# Patient Record
Sex: Male | Born: 1980 | Race: White | Hispanic: No | Marital: Single | Smoking: Current every day smoker
Health system: Southern US, Community
[De-identification: ages and names within clinical notes are randomized; demographics above are authoritative.]

## PROBLEM LIST (undated history)

## (undated) DIAGNOSIS — I10 Essential (primary) hypertension: Secondary | ICD-10-CM

## (undated) HISTORY — PX: ANAL FISSURE REPAIR: SHX2312

---

## 2005-05-07 ENCOUNTER — Emergency Department: Payer: Self-pay | Admitting: Internal Medicine

## 2005-05-21 ENCOUNTER — Emergency Department: Payer: Self-pay | Admitting: Emergency Medicine

## 2005-07-26 ENCOUNTER — Emergency Department: Payer: Self-pay | Admitting: Emergency Medicine

## 2005-09-24 ENCOUNTER — Emergency Department: Payer: Self-pay | Admitting: Emergency Medicine

## 2006-09-10 ENCOUNTER — Emergency Department: Payer: Self-pay | Admitting: Emergency Medicine

## 2007-07-17 ENCOUNTER — Emergency Department: Payer: Self-pay | Admitting: Emergency Medicine

## 2008-04-27 ENCOUNTER — Ambulatory Visit: Payer: Self-pay | Admitting: Family Medicine

## 2008-05-28 ENCOUNTER — Emergency Department: Payer: Self-pay | Admitting: Emergency Medicine

## 2008-10-07 ENCOUNTER — Emergency Department: Payer: Self-pay | Admitting: Emergency Medicine

## 2008-12-15 ENCOUNTER — Emergency Department: Payer: Self-pay | Admitting: Unknown Physician Specialty

## 2009-05-06 ENCOUNTER — Emergency Department: Payer: Self-pay | Admitting: Emergency Medicine

## 2010-06-03 ENCOUNTER — Emergency Department: Payer: Self-pay | Admitting: Emergency Medicine

## 2010-10-25 ENCOUNTER — Emergency Department: Payer: Self-pay | Admitting: Emergency Medicine

## 2010-12-25 ENCOUNTER — Emergency Department: Payer: Self-pay | Admitting: Emergency Medicine

## 2011-07-21 ENCOUNTER — Emergency Department: Payer: Self-pay | Admitting: Unknown Physician Specialty

## 2011-10-21 ENCOUNTER — Observation Stay: Payer: Self-pay | Admitting: Surgery

## 2011-10-21 LAB — BASIC METABOLIC PANEL
Anion Gap: 8 (ref 7–16)
BUN: 10 mg/dL (ref 7–18)
Calcium, Total: 9.1 mg/dL (ref 8.5–10.1)
Creatinine: 0.76 mg/dL (ref 0.60–1.30)
EGFR (Non-African Amer.): >60
Glucose: 89 mg/dL (ref 65–99)
Osmolality: 276 (ref 275–301)
Sodium: 139 mmol/L (ref 136–145)

## 2011-10-21 LAB — CBC
HCT: 47.6 % (ref 40.0–52.0)
HGB: 16.6 g/dL (ref 13.0–18.0)
MCH: 32.1 pg (ref 26.0–34.0)
MCHC: 34.9 g/dL (ref 32.0–36.0)
Platelet: 287 10*3/uL (ref 150–440)
RBC: 5.17 10*6/uL (ref 4.40–5.90)

## 2012-12-09 ENCOUNTER — Emergency Department: Payer: Self-pay

## 2013-03-06 ENCOUNTER — Emergency Department: Payer: Self-pay | Admitting: Emergency Medicine

## 2013-11-26 ENCOUNTER — Emergency Department: Payer: Self-pay | Admitting: Emergency Medicine

## 2014-02-26 ENCOUNTER — Emergency Department: Payer: Self-pay | Admitting: Emergency Medicine

## 2014-07-26 ENCOUNTER — Emergency Department: Payer: Self-pay | Admitting: Emergency Medicine

## 2014-07-27 LAB — CBC WITH DIFFERENTIAL/PLATELET
BASOS PCT: 0.6 %
Basophil #: 0.1 10*3/uL (ref 0.0–0.1)
EOS ABS: 0.2 10*3/uL (ref 0.0–0.7)
EOS PCT: 1.1 %
HCT: 43.5 % (ref 40.0–52.0)
HGB: 15.1 g/dL (ref 13.0–18.0)
LYMPHS PCT: 16.2 %
Lymphocyte #: 2.5 10*3/uL (ref 1.0–3.6)
MCH: 32.1 pg (ref 26.0–34.0)
MCHC: 34.7 g/dL (ref 32.0–36.0)
MCV: 93 fL (ref 80–100)
Monocyte #: 1.6 x10 3/mm — ABNORMAL HIGH (ref 0.2–1.0)
Monocyte %: 10.4 %
NEUTROS ABS: 10.8 10*3/uL — AB (ref 1.4–6.5)
Neutrophil %: 71.7 %
Platelet: 277 10*3/uL (ref 150–440)
RBC: 4.69 10*6/uL (ref 4.40–5.90)
RDW: 13 % (ref 11.5–14.5)
WBC: 15.1 10*3/uL — AB (ref 3.8–10.6)

## 2014-07-27 LAB — COMPREHENSIVE METABOLIC PANEL
ALBUMIN: 3.4 g/dL (ref 3.4–5.0)
ANION GAP: 7 (ref 7–16)
Alkaline Phosphatase: 93 U/L
BUN: 11 mg/dL (ref 7–18)
Bilirubin,Total: 0.3 mg/dL (ref 0.2–1.0)
CO2: 27 mmol/L (ref 21–32)
Calcium, Total: 8.5 mg/dL (ref 8.5–10.1)
Chloride: 105 mmol/L (ref 98–107)
Creatinine: 0.74 mg/dL (ref 0.60–1.30)
EGFR (African American): >60
EGFR (Non-African Amer.): >60
GLUCOSE: 116 mg/dL — AB (ref 65–99)
OSMOLALITY: 278 (ref 275–301)
Potassium: 3.8 mmol/L (ref 3.5–5.1)
SGOT(AST): 22 U/L (ref 15–37)
SGPT (ALT): 23 U/L
Sodium: 139 mmol/L (ref 136–145)
Total Protein: 7.1 g/dL (ref 6.4–8.2)

## 2014-11-20 NOTE — Op Note (Signed)
PATIENT NAME:  Devin ScaleROACH, [place] M MR#:  [id number] DATE OF BIRTH:  March 14, 1981  DATE OF PROCEDURE:  10/21/2011  PREOPERATIVE DIAGNOSIS: Posterior perianal superficial abscess.   POSTOPERATIVE DIAGNOSIS: Chronic posterior anal fissure.   PROCEDURES PERFORMED:  Examination under anesthesia, unroofing of posterior anal fissure,  with drainage of superficial perianal abscess, open lateral internal sphincterotomy on the left side.   SURGEON: Raynald KempMark A Kathrine Rieves, M.D.   ANESTHESIA: General with LMA and 30 mL of 0.5% Marcaine with epinephrine.   INDICATIONS: This is a 34 year old white male who has been treated in the past for an anal fissure with medication.  This has gotten worse over the last several months. He presents to the Emergency Room today with exquisite pain and some question of infection. Limited examination in the Emergency Room demonstrated what appeared to be a perianal abscess. I discussed with him and his mother bringing him to the operating room for definitive diagnosis, drainage of the abscess, and any other further therapy based on the findings. They understand and wish to proceed.   FINDINGS: Chronic appearing large posterior anal fissure. Small superficial perianal posterior midline abscess. No fistula formation. 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   SPECIMENS: None.   ESTIMATED BLOOD LOSS: 25 mL.   DESCRIPTION OF PROCEDURE: With the patient in the supine position, general anesthesia with LMA was induced. He was then padded and positioned in dorsal lithotomy with candy-cane stirrups. The perineum was sterilely prepped and draped with Betadine solution. Time-out procedure was observed.   A digital examination demonstrated a very tight internal sphincter. In the posterior midline was a large, chronic-appearing anal fissure. It was tracking along a skin tag which appeared to be very irritated.  This skin tag was unroofed and the area was debrided with sharp technique with a scalpel.  Small amount of pus waqs drained.  Hemostasis was obtained with point cautery. A small incision was fashioned over the ano dentate line mucosa with cautery and the internal sphincter was identified, encircled with a hemostat clamp, and divided with electrocautery. The remaining small fibers more cephalad were divided with finger fracture technique. Hemostasis was obtained with direct pressure and the application of 30 mL of 0.5% Marcaine with epinephrine. The mucosal defect was reapproximated with a running locking 3-0 chromic suture.  With hemostasis being obtained on the operative field, the anal canal was packed with Gelfoam and Avitene. Pads were placed with mesh surgical underwear and the patient was then subsequently returned supine, extubated, and taken to the recovery room in stable and satisfactory condition by anesthesia.     ____________________________ Redge GainerMark A. Egbert GaribaldiBird, MD mab:bjt D: 10/21/2011 22:29:36 ET T: 10/22/2011 10:39:43 ET JOB#: [id number]  cc: Loraine LericheMark A. Egbert GaribaldiBird, MD, <Dictator> Raynald KempMARK A Conchita Truxillo MD ELECTRONICALLY SIGNED 10/22/2011 23:42

## 2014-11-20 NOTE — H&P (Signed)
    History of Present Illness Anal pain and drainage for months. Pain much worse in last few days. No fever. Been to [facility] twice and ttreated for genital herpes. Never seen a [facility]. Hurts the worst when he sits; not when he defecates. Last meal at 10AM today.    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[employer] Home  with his mother   Review of Systems:   Fever/Chills No    Cough No    Sputum No    Abdominal Pain No    Diarrhea No    Constipation No    Nausea/Vomiting No    SOB/DOE No    Chest Pain No    Dysuria No    Tolerating PT Yes    Tolerating Diet Yes   Physical Exam:   GEN WD, WN, obese    HEENT pink conjunctivae, PERRL, hearing intact to voice, moist oral mucosa, Oropharynx clear, good dentition    NECK supple  No masses    RESP normal resp effort  clear BS    CARD regular rate  no murmur  no Rub    ABD denies tenderness  no liver/spleen enlargement  soft  normal BS    LYMPH negative neck    SKIN skin turgor good    NEURO cranial nerves intact, follows commands, strength:, motor/sensory function intact    PSYCH alert, A+O to time, place, person, good insight    Additional Comments anal exam reveals erythema and edema in perianal skin, with purulent drainage from a superficial posterior midline perianal abscess. DRE not done.     Assessment/Admission Diagnosis Perirectal abscess    Plan EUA, Drainage of Perianal abscess   Electronic Signatures: Claude MangesMarterre, Elham Fini F (MD)  (Signed  25-Mar-13 18:48)  Authored: CHIEF COMPLAINT and HISTORY, PAST MEDICAL/SURGIAL HISTORY, ALLERGIES, HOME MEDICATIONS, FAMILY AND SOCIAL HISTORY, REVIEW OF SYSTEMS, PHYSICAL EXAM, ASSESSMENT AND PLAN   Last Updated: 25-Mar-13 18:48 by Claude MangesMarterre, Hue Frick F (MD)

## 2016-03-02 ENCOUNTER — Emergency Department
Admission: EM | Admit: 2016-03-02 | Discharge: 2016-03-02 | Disposition: A | Discharge status: Home / Self Care | Payer: Self-pay | Attending: Emergency Medicine | Admitting: Emergency Medicine

## 2016-03-02 ENCOUNTER — Encounter: Payer: Self-pay | Admitting: Emergency Medicine

## 2016-03-02 DIAGNOSIS — I10 Essential (primary) hypertension: Secondary | ICD-10-CM | POA: Insufficient documentation

## 2016-03-02 DIAGNOSIS — K0889 Other specified disorders of teeth and supporting structures: Secondary | ICD-10-CM | POA: Insufficient documentation

## 2016-03-02 DIAGNOSIS — F17210 Nicotine dependence, cigarettes, uncomplicated: Secondary | ICD-10-CM | POA: Insufficient documentation

## 2016-03-02 HISTORY — DX: Essential (primary) hypertension: I10

## 2016-03-02 MED ORDER — AMOXICILLIN 500 MG PO TABS: 500.0000 mg | ORAL_TABLET | Freq: Three times a day (TID) | ORAL | 0 refills | Status: DC

## 2016-03-02 MED ORDER — NAPROXEN 500 MG PO TABS: 500.0000 mg | ORAL_TABLET | Freq: Two times a day (BID) | ORAL | 0 refills | Status: AC

## 2016-03-02 NOTE — ED Triage Notes (Signed)
Bilateral upper dental pain x 2 days.

## 2016-03-02 NOTE — ED Provider Notes (Signed)
[facility] Emergency Department Provider Note ____________________________________________  Time seen: Approximately 3:16 PM  I have reviewed the triage vital signs and the nursing notes.   HISTORY  Chief Complaint Dental Pain   HPI Devin Barnes is a 35 y.o. male presents to the emergency department for evaluation of dental pain.Pain is been present for the past 2 days. He has taken ibuprofen without relief. He has been unable to schedule an appointment with his dentist.  Past Medical History:  Diagnosis Date  . Hypertension     There are no active problems to display for this patient.   Past Surgical History:  Procedure Laterality Date  . ANAL FISSURE REPAIR      Prior to Admission medications   Medication Sig Start Date End Date Taking? Authorizing Provider  amoxicillin (AMOXIL) 500 MG tablet Take 1 tablet (500 mg total) by mouth 3 (three) times daily. 03/02/16   Chinita Pester, FNP  naproxen (NAPROSYN) 500 MG tablet Take 1 tablet (500 mg total) by mouth 2 (two) times daily with a meal. 03/02/16 03/02/17  Chinita Pester, FNP    Allergies Review of patient's allergies indicates no known allergies.  No family history on file.  Social History Social History  Substance Use Topics  . Smoking status: Current Every Day Smoker    Packs/day: 1.00    Types: Cigarettes  . Smokeless tobacco: Not on file  . Alcohol use No    Review of Systems Constitutional: No recent illness. Well appearing. ENT: No cough or rhinorrhea Musculoskeletal: Negative for jaw pain/trismus. Skin: Negative for edema or erythema. ____________________________________________   PHYSICAL EXAM:  VITAL SIGNS: ED Triage Vitals  Enc Vitals Group     BP 03/02/16 1431 (!) 158/110     Pulse Rate 03/02/16 1431 88     Resp 03/02/16 1431 18     Temp 03/02/16 1431 98 F (36.7 C)     Temp Source 03/02/16 1431 Oral     SpO2 03/02/16 1431 98 %     Weight 03/02/16 1431 280 lb  (127 kg)     Height 03/02/16 1431  (1.778 m)     Head Circumference --      Peak Flow --      Pain Score 03/02/16 1433 8     Pain Loc --      Pain Edu? --      Excl. in GC? --     Constitutional: Alert and oriented. Well appearing and in no acute distress. Eyes: Conjunctivae are normal.EOMI. Mouth/Throat: Mucous membranes are moist. Oropharynx non-erythematous. Periodontal Exam    Hematological/Lymphatic/Immunilogical: No cervical lymphadenopathy. Respiratory: Normal respiratory effort.  Musculoskeletal: Full ROM x 4 extremities. Neurologic:  Normal speech and language. No gross focal neurologic deficits are appreciated. Speech is normal. No gait instability. Skin:  Atraumatic without edema or erythema of the fact. Psychiatric: Mood and affect are normal. Speech and behavior are normal.  ____________________________________________   LABS (all labs ordered are listed, but only abnormal results are displayed)  Labs Reviewed - No data to display ____________________________________________   RADIOLOGY   ____________________________________________   PROCEDURES  Procedure(s) performed: None  Critical Care performed: No  ____________________________________________   INITIAL IMPRESSION / ASSESSMENT AND PLAN / ED COURSE  Pertinent labs & imaging results that were available during my care of the patient were reviewed by me and considered in my medical decision making (see chart for details). Patient will be prescribed Amoxicillin and Naprosyn. Patient was  advised to see the dentist within 14 days. Also advised to take the antibiotic until finished. Instructed to return to the ER for symptoms that change or worsen if you are unable to schedule an appointment. ____________________________________________   FINAL CLINICAL IMPRESSION(S) / ED DIAGNOSES  Final diagnoses:  Pain, dental    Note:  This document was prepared using Dragon voice recognition software  and may include unintentional dictation errors.    Chinita Pester, FNP 03/02/16 1548    Jennye Moccasin, MD 03/02/16 [phone]

## 2017-04-17 ENCOUNTER — Emergency Department
Admission: EM | Admit: 2017-04-17 | Discharge: 2017-04-17 | Disposition: A | Discharge status: Home / Self Care | Payer: Self-pay | Attending: Emergency Medicine | Admitting: Emergency Medicine

## 2017-04-17 ENCOUNTER — Encounter: Payer: Self-pay | Admitting: Emergency Medicine

## 2017-04-17 DIAGNOSIS — F17210 Nicotine dependence, cigarettes, uncomplicated: Secondary | ICD-10-CM | POA: Insufficient documentation

## 2017-04-17 DIAGNOSIS — I10 Essential (primary) hypertension: Secondary | ICD-10-CM | POA: Insufficient documentation

## 2017-04-17 DIAGNOSIS — K029 Dental caries, unspecified: Secondary | ICD-10-CM | POA: Insufficient documentation

## 2017-04-17 MED ORDER — LIDOCAINE-EPINEPHRINE 2 %-1:100000 IJ SOLN
1.7000 mL | Freq: Once | INTRAMUSCULAR | Status: AC
  Administered 2017-04-17: 1.7 mL
  Filled 2017-04-17: qty 1.7

## 2017-04-17 MED ORDER — PENICILLIN V POTASSIUM 500 MG PO TABS
500.0000 mg | ORAL_TABLET | Freq: Once | ORAL | Status: AC
  Administered 2017-04-17: 500 mg via ORAL
  Filled 2017-04-17: qty 1

## 2017-04-17 MED ORDER — TRAMADOL HCL 50 MG PO TABS: 50.0000 mg | ORAL_TABLET | Freq: Two times a day (BID) | ORAL | 0 refills | Status: DC

## 2017-04-17 MED ORDER — PENICILLIN V POTASSIUM 500 MG PO TABS: 500.0000 mg | ORAL_TABLET | Freq: Four times a day (QID) | ORAL | 0 refills | Status: DC

## 2017-04-17 NOTE — ED Triage Notes (Signed)
Patient presents to the ED with dental pain to the right side of his mouth.  Patient is in no obvious distress at this time.

## 2017-04-17 NOTE — ED Notes (Addendum)
See triage note  States he thinks that he has 2 "bad"teeth on the right  Thinks he may have lost the fillings  States he took some ibu and some tramadol that he had left

## 2017-04-17 NOTE — ED Provider Notes (Signed)
[facility] Emergency Department Provider Note ____________________________________________  Time seen: 1551  I have reviewed the triage vital signs and the nursing notes.  HISTORY  Chief Complaint  Dental Pain  HPI Devin Barnes is a 36 y.o. male presents to the ED for evaluation of right upper jaw pain after drinking a soda. He has a history of poor dentition. He denies any fevers, chills, sweats, or nausea. He also denies any facial swelling, difficulty swallowing, or spontaneous drainage. He has taken ibuprofen, an old tramadol, and used topical EtOH with limited relief.   Past Medical History:  Diagnosis Date  . Hypertension    There are no active problems to display for this patient.  Past Surgical History:  Procedure Laterality Date  . ANAL FISSURE REPAIR      Prior to Admission medications   Medication Sig Start Date End Date Taking? Authorizing Provider  amoxicillin (AMOXIL) 500 MG tablet Take 1 tablet (500 mg total) by mouth 3 (three) times daily. 03/02/16   Triplett, Rulon Eisenmenger B, FNP  penicillin v potassium (VEETID) 500 MG tablet Take 1 tablet (500 mg total) by mouth 4 (four) times daily. 04/17/17   Evaan Tidwell, Charlesetta Ivory, PA-C  traMADol (ULTRAM) 50 MG tablet Take 1 tablet (50 mg total) by mouth 2 (two) times daily. 04/17/17   Renessa Wellnitz, Charlesetta Ivory, PA-C    Allergies Patient has no known allergies.  No family history on file.  Social History Social History  Substance Use Topics  . Smoking status: Current Every Day Smoker    Packs/day: 1.00    Types: Cigarettes  . Smokeless tobacco: Never Used  . Alcohol use No    Review of Systems  Constitutional: Negative for fever. Eyes: Negative for visual changes. ENT: Negative for sore throat. Right upper jaw pain as above.  Cardiovascular: Negative for chest pain. Respiratory: Negative for shortness of breath. Gastrointestinal: Negative for abdominal pain, vomiting and diarrhea. Neurological:  Negative for headaches, focal weakness or numbness. ____________________________________________  PHYSICAL EXAM:  VITAL SIGNS: ED Triage Vitals  Enc Vitals Group     BP 04/17/17 1524 (!) 167/103     Pulse Rate 04/17/17 1524 98     Resp 04/17/17 1524 18     Temp 04/17/17 1524 98.5 F (36.9 C)     Temp Source 04/17/17 1524 Oral     SpO2 04/17/17 1524 99 %     Weight 04/17/17 1524 280 lb (127 kg)     Height 04/17/17 1524  (1.778 m)     Head Circumference --      Peak Flow --      Pain Score 04/17/17 1523 8     Pain Loc --      Pain Edu? --      Excl. in GC? --     Constitutional: Alert and oriented. Well appearing and in no distress. Head: Normocephalic and atraumatic. Eyes: Conjunctivae are normal. Normal extraocular movements Ears: Canals clear. TMs intact bilaterally. Mouth/Throat: Generally poor dentition. Patient with chronically broken and decayed molars at the site of localized pain. Mucous membranes are moist. Uvula is midline and tonsils are flat. No focal swelling, edema, or fluctuance. No brawny sublingual swelling. No oral lesions.  Neck: Supple. No thyromegaly. Hematological/Lymphatic/Immunological: No cervical lymphadenopathy. Cardiovascular: Normal rate, regular rhythm. Normal distal pulses. Respiratory: Normal respiratory effort. No wheezes/rales/rhonchi. ____________________________________________  PROCEDURES  Pen VK 500 mg PO  DENTAL BLOCK  Authorized by: Lissa Hoard  Performed by:  Rose Clousing, PA-S Sherrie Sport) Consent: Verbal consent obtained. Required items: devices and special equipment available Time out: Immediately prior to procedure a "time out" was called to verify the correct patient, procedure, equipment, support staff and site/side marked as required.  Indication: pain Nerve block body site: right upper 2nd & 3rd molar  Preparation: Patient was prepped and draped in the usual sterile fashion. Needle gauge: 27 G Location  technique: anatomical landmarks  Local anesthetic: 2%-1:100000 lido w/ epi   Anesthetic total: 1.7 ml  Outcome: pain improved Patient tolerance: Patient tolerated the procedure well with no immediate complications. ____________________________________________  INITIAL IMPRESSION / ASSESSMENT AND PLAN / ED COURSE  Patient with ED evaluation of He is discharged with a prescription for penicillin  VK and Ultram (#10) to dose as directed. He will follow-up with the [facility] or a local dental provider for definitive treatment. Return precautions are provided.  ____________________________________________  FINAL CLINICAL IMPRESSION(S) / ED DIAGNOSES  Final diagnoses:  Pain due to dental caries      Lissa Hoard, PA-C 04/17/17 1701    Rockne Menghini, MD 04/17/17 2349

## 2017-04-17 NOTE — Discharge Instructions (Signed)
Take the antibiotic as directed until all pills are gone. Rinse with warm-salty water after every meal. Use a soft-bristled toothbrush twice daily. Follow-up with one of the local dental provider for routine dental care and extractions.   OPTIONS FOR DENTAL FOLLOW UP CARE  [place] [facility] - Local Safety Net Dental Clinics [email or web address]   [facility] [phone])  [facility] [phone])  [place] [phone] ext [phone])  [facility] [phone])  [facility] [phone]) This clinic caters to the indigent population and is on a lottery system. Location: [employer] [facility], [employer], [address] [place] Clinic Hours: Wednesdays from 6pm - 9pm, patients seen by a lottery system. For dates, call or go to [email or web address] Services: Cleanings, fillings and simple extractions. Payment Options: DENTAL WORK IS FREE OF CHARGE. Bring proof of income or support. Best way to get seen: Arrive at 5:15 pm - this is a lottery, NOT first come/first serve, so arriving earlier will not increase your chances of being seen.     [facility] [phone] Select option 1 for emergencies   Location: [facility], [place], [address], [place] Clinic Hours: No walk-ins accepted - call the day before to schedule an appointment. Check in times are 9:30 am and 1:30 pm. Services: Simple extractions, temporary fillings, pulpectomy/pulp debridement, uncomplicated abscess drainage. Payment Options: PAYMENT IS DUE AT THE TIME OF SERVICE.  Fee is usually $100-200, additional surgical procedures (e.g. abscess drainage) may be extra. Cash, checks, Visa/MasterCard accepted.  Can file Medicaid if patient is covered for dental - patient should call case worker to  check. No discount for [facility] patients. Best way to get seen: MUST call the day before and get onto the schedule. Can usually be seen the next 1-2 days. No walk-ins accepted.     [facility] [phone]   Location: [facility], [address], [place] Clinic Hours: M, W, Th, F 8am or 1:30pm, Tues 9a or 1:30 - first come/first served. Services: Simple extractions, temporary fillings, uncomplicated abscess drainage.  You do not need to be an [facility] resident. Payment Options: PAYMENT IS DUE AT THE TIME OF SERVICE. Dental insurance, otherwise sliding scale - bring proof of income or support. Depending on income and treatment needed, cost is usually $50-200. Best way to get seen: Arrive early as it is first come/first served.     [facility] [phone]   Location: [address] Clinic Hours: Mon-Thu 8a-5p Services: Most basic dental services including extractions and fillings. Payment Options: PAYMENT IS DUE AT THE TIME OF SERVICE. Sliding scale, up to 50% off - bring proof if income or support. Medicaid with dental option accepted. Best way to get seen: Call to schedule an appointment, can usually be seen within 2 weeks OR they will try to see walk-ins - show up at 8a or 2p (you may have to wait).     [facility] [phone] [place] RESIDENTS ONLY   Location: [facility], [address] [place], [place] [address] Clinic Hours: By appointment only. Monday - Thursday 8am-5pm, Friday 8am-12pm Services: Cleanings, fillings, extractions. Payment Options: PAYMENT IS DUE AT THE TIME OF SERVICE. Cash, Visa or MasterCard. Sliding scale - $30 minimum per service. Best way to get seen: Come in to office, complete packet and make an appointment - need proof  of income or support monies for each household member and proof of [facility]  residence. Usually takes about a month to get in.     [facility] [phone]   Location: [address] [facility] Clinic Hours: Walk-in Urgent Care Dental Services are offered Monday-Friday mornings only. The numbers of emergencies accepted daily is limited to the number of providers available. Maximum 15 - Mondays, Wednesdays & Thursdays Maximum 10 - Tuesdays & Fridays Services: You do not need to be a [facility] resident to be seen for a dental emergency. Emergencies are defined as pain, swelling, abnormal bleeding, or dental trauma. Walkins will receive x-rays if needed. NOTE: Dental cleaning is not an emergency. Payment Options: PAYMENT IS DUE AT THE TIME OF SERVICE. Minimum co-pay is $40.00 for uninsured patients. Minimum co-pay is $3.00 for Medicaid with dental coverage. Dental Insurance is accepted and must be presented at time of visit. Medicare does not cover dental. Forms of payment: Cash, credit card, checks. Best way to get seen: If not previously registered with the clinic, walk-in dental registration begins at 7:15 am and is on a first come/first serve basis. If previously registered with the clinic, call to make an appointment.     [facility] [phone] [place] RESIDENTS ONLY   Location: [address], [place], [place] Clinic Hours: Mon-Thu 10a-2p Services: Extractions only! Payment Options: FREE (donations accepted) - bring proof of income or support Best way to get seen: Call and schedule an appointment OR come at 8am on the 1st Monday of every month (except for holidays) when it is first come/first served.     [facility] [phone]   Location: [address] [place], [place] Clinic Hours: Friday mornings Services, Payment Options, Best way to get seen: Call for info

## 2017-10-21 ENCOUNTER — Emergency Department
Admission: EM | Admit: 2017-10-21 | Discharge: 2017-10-21 | Disposition: A | Discharge status: Home / Self Care | Payer: Self-pay | Attending: Emergency Medicine | Admitting: Emergency Medicine

## 2017-10-21 ENCOUNTER — Emergency Department: Payer: Self-pay

## 2017-10-21 ENCOUNTER — Encounter: Payer: Self-pay | Admitting: *Deleted

## 2017-10-21 DIAGNOSIS — Z23 Encounter for immunization: Secondary | ICD-10-CM | POA: Insufficient documentation

## 2017-10-21 DIAGNOSIS — W230XXA Caught, crushed, jammed, or pinched between moving objects, initial encounter: Secondary | ICD-10-CM | POA: Insufficient documentation

## 2017-10-21 DIAGNOSIS — Y9389 Activity, other specified: Secondary | ICD-10-CM | POA: Insufficient documentation

## 2017-10-21 DIAGNOSIS — I10 Essential (primary) hypertension: Secondary | ICD-10-CM | POA: Insufficient documentation

## 2017-10-21 DIAGNOSIS — Y999 Unspecified external cause status: Secondary | ICD-10-CM | POA: Insufficient documentation

## 2017-10-21 DIAGNOSIS — F17210 Nicotine dependence, cigarettes, uncomplicated: Secondary | ICD-10-CM | POA: Insufficient documentation

## 2017-10-21 DIAGNOSIS — Y929 Unspecified place or not applicable: Secondary | ICD-10-CM | POA: Insufficient documentation

## 2017-10-21 DIAGNOSIS — S61210A Laceration without foreign body of right index finger without damage to nail, initial encounter: Secondary | ICD-10-CM | POA: Insufficient documentation

## 2017-10-21 MED ORDER — BUPIVACAINE HCL 0.25 % IJ SOLN: 30.0000 mL | Freq: Once | INTRAMUSCULAR | Status: DC

## 2017-10-21 MED ORDER — BUPIVACAINE HCL (PF) 0.25 % IJ SOLN
INTRAMUSCULAR | Status: AC
  Filled 2017-10-21: qty 30

## 2017-10-21 MED ORDER — HYDROCODONE-ACETAMINOPHEN 5-325 MG PO TABS: 1.0000 | ORAL_TABLET | Freq: Four times a day (QID) | ORAL | 0 refills | Status: DC | PRN

## 2017-10-21 MED ORDER — TETANUS-DIPHTH-ACELL PERTUSSIS 5-2.5-18.5 LF-MCG/0.5 IM SUSP
0.5000 mL | Freq: Once | INTRAMUSCULAR | Status: AC
  Administered 2017-10-21: 0.5 mL via INTRAMUSCULAR
  Filled 2017-10-21: qty 0.5

## 2017-10-21 NOTE — Discharge Instructions (Signed)
Today I put in a total of 5 6-0 nylon sutures.  Please keep your hand clean and dry for the next 24 hours and thereafter use warm soapy water to help keep it clean.  Follow-up with the hand specialist in 2 days for reevaluation return to the emergency department sooner for any concerns whatsoever.  It was a pleasure to take care of you today, and thank you for coming to our emergency department.  If you have any questions or concerns before leaving please ask the nurse to grab me and I'm more than happy to go through your aftercare instructions again.  If you were prescribed any opioid pain medication today such as Norco, Vicodin, Percocet, morphine, hydrocodone, or oxycodone please make sure you do not drive when you are taking this medication as it can alter your ability to drive safely.  If you have any concerns once you are home that you are not improving or are in fact getting worse before you can make it to your follow-up appointment, please do not hesitate to call [phone] and come back for further evaluation.  Merrily BrittleNeil Eloisa Chokshi, MD  No results found for this or any previous visit. Dg Finger Index Right  Result Date: 10/21/2017 CLINICAL DATA:  Laceration and crush injury EXAM: RIGHT INDEX FINGER 2+V COMPARISON:  None. FINDINGS: There is no evidence of fracture or dislocation. There is no evidence of arthropathy or other focal bone abnormality. Soft tissue laceration medial to the proximal interphalangeal joint without radiopaque foreign body. IMPRESSION: No fracture or dislocation of the right index finger. Electronically Signed   By: Deatra RobinsonKevin  Herman M.D.   On: 10/21/2017 02:07

## 2017-10-21 NOTE — ED Provider Notes (Signed)
[facility] Emergency Department Provider Note  ____________________________________________   First MD Initiated Contact with Patient 10/21/17 [id number]     (approximate)  I have reviewed the triage vital signs and the nursing notes.   HISTORY  Chief Complaint Hand Pain    HPI Devin Barnes is a 37 y.o. male who self presents to the emergency department with an injury to his right index finger.  He is a Mudloggerright-hand-dominant machinist.  This evening he was helping a friend lift a washing machine up the stairs when he slipped and fell down crushing his right index finger.  His tetanus is unknown.  He suffered immediate severe pain in his right index finger.  Pain is nonradiating.  It is sharp.  It is worse with movement improved with rest.  He did not washout the wound.  Past Medical History:  Diagnosis Date  . Hypertension     There are no active problems to display for this patient.   Past Surgical History:  Procedure Laterality Date  . ANAL FISSURE REPAIR      Prior to Admission medications   Medication Sig Start Date End Date Taking? Authorizing Provider  amoxicillin (AMOXIL) 500 MG tablet Take 1 tablet (500 mg total) by mouth 3 (three) times daily. 03/02/16   Triplett, Rulon Eisenmengerari B, FNP  HYDROcodone-acetaminophen (NORCO) 5-325 MG tablet Take 1 tablet by mouth every 6 (six) hours as needed for up to 7 doses for severe pain. 10/21/17   Merrily Brittleifenbark, Jem Castro, MD  penicillin v potassium (VEETID) 500 MG tablet Take 1 tablet (500 mg total) by mouth 4 (four) times daily. 04/17/17   Menshew, Charlesetta IvoryJenise V Bacon, PA-C  traMADol (ULTRAM) 50 MG tablet Take 1 tablet (50 mg total) by mouth 2 (two) times daily. 04/17/17   Menshew, Charlesetta IvoryJenise V Bacon, PA-C    Allergies Patient has no known allergies.  No family history on file.  Social History Social History   Tobacco Use  . Smoking status: Current Every Day Smoker    Packs/day: 1.00    Types: Cigarettes  . Smokeless tobacco:  Never Used  Substance Use Topics  . Alcohol use: No  . Drug use: Never    Review of Systems Constitutional: No fever/chills ENT: No sore throat. Cardiovascular: Denies chest pain. Respiratory: Denies shortness of breath. Gastrointestinal: No abdominal pain.  No nausea, no vomiting.  No diarrhea.  No constipation. Musculoskeletal: Negative for back pain. Neurological: Negative for headaches   ____________________________________________   PHYSICAL EXAM:  VITAL SIGNS: ED Triage Vitals  Enc Vitals Group     BP 10/21/17 0124 (!) 153/108     Pulse Rate 10/21/17 0124 (!) 107     Resp 10/21/17 0124 16     Temp 10/21/17 0124 99.1 F (37.3 C)     Temp src --      SpO2 10/21/17 0124 99 %     Weight 10/21/17 0124 300 lb (136.1 kg)     Height 10/21/17 0124 5\' 10"  (1.778 m)     Head Circumference --      Peak Flow --      Pain Score 10/21/17 0123 0     Pain Loc --      Pain Edu? --      Excl. in GC? --     Constitutional: Alert and oriented x4 joking laughing well-appearing nontoxic no diaphoresis speaks full clear sentences Head: Atraumatic. Nose: No congestion/rhinnorhea. Mouth/Throat: No trismus Neck: No stridor.   Cardiovascular: Regular rate  and rhythm Respiratory: Normal respiratory effort.  No retractions Neurologic:  Normal speech and language. No gross focal neurologic deficits are appreciated.  Skin: 3 cm laceration to the volar surface of right index finger roughly over the PIP.  He can fully flex at MCP PIP and DIP.  Wound explored in a bloodless field through full range of motion and no tendon involvement identified.    ____________________________________________  LABS (all labs ordered are listed, but only abnormal results are displayed)  Labs Reviewed - No data to display   __________________________________________  EKG   ____________________________________________  RADIOLOGY  X-ray of the finger reviewed by me with no evidence of  fracture ____________________________________________   DIFFERENTIAL includes but not limited to  Laceration, tendon injury, fracture   PROCEDURES  Procedure(s) performed: Yes  .Marland KitchenLaceration Repair Date/Time: 10/21/2017 4:45 AM Performed by: Merrily Brittle, MD Authorized by: Merrily Brittle, MD   Consent:    Consent obtained:  Verbal   Consent given by:  Patient   Risks discussed:  Infection, pain, retained foreign body, poor cosmetic result and poor wound healing Anesthesia (see MAR for exact dosages):    Anesthesia method:  Local infiltration   Local anesthetic:  Lidocaine 1% WITH epi Laceration details:    Location:  Finger   Finger location:  R index finger   Length (cm):  3 Repair type:    Repair type:  Simple Pre-procedure details:    Preparation:  Patient was prepped and draped in usual sterile fashion and imaging obtained to evaluate for foreign bodies Exploration:    Hemostasis achieved with:  Direct pressure   Wound exploration: entire depth of wound probed and visualized     Contaminated: no   Treatment:    Area cleansed with:  Soap and water   Amount of cleaning:  Extensive   Irrigation solution:  Tap water   Irrigation method:  Tap   Visualized foreign bodies/material removed: no   Skin repair:    Repair method:  Sutures   Suture size:  6-0   Suture material:  Nylon   Number of sutures:  5 Approximation:    Approximation:  Close Post-procedure details:    Dressing:  Sterile dressing   Patient tolerance of procedure:  Tolerated well, no immediate complications    Critical Care performed: no  Observation: no ____________________________________________   INITIAL IMPRESSION / ASSESSMENT AND PLAN / ED COURSE  Pertinent labs & imaging results that were available during my care of the patient were reviewed by me and considered in my medical decision making (see chart for details).  Patient arrives with a crush injury sustaining a laceration to  the volar surface of his dominant index finger.  X-ray with no foreign bodies and no evidence of fracture.  Digital block performed and the wound was then washed out with copious amounts of soap and water.  I then explored the wound in a bloodless field with good overhead lighting through full range of motion and identified no tendon injuries.  Wound was then closed with a total of 5 6-0 nylon sutures with good cosmesis.  Tetanus updated.  We will have the patient follow-up with Orth O hand in 2 days for recheck.  Strict return precautions have been given and the patient verbalizes understanding and agreement the plan.      ____________________________________________   FINAL CLINICAL IMPRESSION(S) / ED DIAGNOSES  Final diagnoses:  Laceration of right index finger without foreign body without damage to nail, initial encounter  NEW MEDICATIONS STARTED DURING THIS VISIT:  Discharge Medication List as of 10/21/2017  4:42 AM    START taking these medications   Details  HYDROcodone-acetaminophen (NORCO) 5-325 MG tablet Take 1 tablet by mouth every 6 (six) hours as needed for up to 7 doses for severe pain., Starting Tue 10/21/2017, Print         Note:  This document was prepared using Dragon voice recognition software and may include unintentional dictation errors.      Merrily Brittle, MD 10/23/17 2214

## 2017-10-21 NOTE — ED Triage Notes (Signed)
Just 30 minutes PTA, was helping to carry a washer up the steps and it dropped, catching his finger in between the washer and metal steps. Right hand second finger effected, avulsion to the posterior finger, lac to the anterior finger, bleeding controlled. C/o numbness and swelling.

## 2017-10-28 ENCOUNTER — Emergency Department
Admission: EM | Admit: 2017-10-28 | Discharge: 2017-10-28 | Disposition: A | Discharge status: Home / Self Care | Payer: Self-pay | Attending: Emergency Medicine | Admitting: Emergency Medicine

## 2017-10-28 ENCOUNTER — Encounter: Payer: Self-pay | Admitting: Emergency Medicine

## 2017-10-28 ENCOUNTER — Other Ambulatory Visit: Payer: Self-pay

## 2017-10-28 DIAGNOSIS — K0889 Other specified disorders of teeth and supporting structures: Secondary | ICD-10-CM | POA: Insufficient documentation

## 2017-10-28 DIAGNOSIS — I10 Essential (primary) hypertension: Secondary | ICD-10-CM | POA: Insufficient documentation

## 2017-10-28 DIAGNOSIS — G8929 Other chronic pain: Secondary | ICD-10-CM | POA: Insufficient documentation

## 2017-10-28 DIAGNOSIS — F17210 Nicotine dependence, cigarettes, uncomplicated: Secondary | ICD-10-CM | POA: Insufficient documentation

## 2017-10-28 DIAGNOSIS — K089 Disorder of teeth and supporting structures, unspecified: Secondary | ICD-10-CM

## 2017-10-28 MED ORDER — LIDOCAINE-EPINEPHRINE 2 %-1:100000 IJ SOLN
INTRAMUSCULAR | Status: AC
  Administered 2017-10-28: 1.7 mL
  Filled 2017-10-28: qty 1.7

## 2017-10-28 MED ORDER — AMOXICILLIN 500 MG PO TABS: 500.0000 mg | ORAL_TABLET | Freq: Three times a day (TID) | ORAL | 0 refills | Status: DC

## 2017-10-28 MED ORDER — TRAMADOL HCL 50 MG PO TABS: 50.0000 mg | ORAL_TABLET | Freq: Four times a day (QID) | ORAL | 0 refills | Status: DC | PRN

## 2017-10-28 MED ORDER — LIDOCAINE-EPINEPHRINE 2 %-1:100000 IJ SOLN
1.7000 mL | Freq: Once | INTRAMUSCULAR | Status: AC
Start: 2017-10-28 — End: 2017-10-28
  Administered 2017-10-28: 1.7 mL
  Filled 2017-10-28: qty 1.7

## 2017-10-28 NOTE — ED Triage Notes (Signed)
Pt to ED c/o right upper dental pain x2-3 days, denies fevers, painful to chew on right side.

## 2017-10-28 NOTE — Discharge Instructions (Signed)
Please call and schedule a dental appointment as soon as possible. You will need to be seen within the next 14 days. Return to the emergency department for symptoms that change or worsen if you're unable to schedule an appointment.  OPTIONS FOR DENTAL FOLLOW UP CARE  [place] Department of Health and Human Services - Local Safety Net Dental Clinics http://www.ncdhhs.gov/dph/oralhealth/services/safetynetclinics.htm   [facility] ([phone])  [facility] ([phone])  [facility] ([phone])  [facility] ([phone])  [facility] ([phone]) This clinic caters to the indigent population and is on a lottery system. Location: [facility], [address], [place] Clinic Hours: Wednesdays from 6pm - 9pm, patients seen by a lottery system. For dates, call or go to [email or web address] Services: Cleanings, fillings and simple extractions. Payment Options: DENTAL WORK IS FREE OF CHARGE. Bring proof of income or support. Best way to get seen: Arrive at 5:15 pm - this is a lottery, NOT first come/first serve, so arriving earlier will not increase your chances of being seen.     [facility] [phone] Select option 1 for emergencies   Location: [facility], [address], [place] Clinic Hours: No walk-ins accepted - call the day before to schedule an appointment. Check in times are 9:30 am and 1:30 pm. Services: Simple extractions, temporary fillings, pulpectomy/pulp debridement, uncomplicated abscess drainage. Payment Options: PAYMENT IS DUE AT THE TIME OF SERVICE.  Fee is usually $100-200, additional surgical procedures (e.g. abscess drainage) may be extra. Cash, checks, Visa/MasterCard accepted.  Can file Medicaid if patient is covered for dental - patient should call case worker to check. No  discount for [facility] patients. Best way to get seen: MUST call the day before and get onto the schedule. Can usually be seen the next 1-2 days. No walk-ins accepted.     [facility] [phone]   Location: [facility], [address], [place] Clinic Hours: M, W, Th, F 8am or 1:30pm, Tues 9a or 1:30 - first come/first served. Services: Simple extractions, temporary fillings, uncomplicated abscess drainage.  You do not need to be an [place] resident. Payment Options: PAYMENT IS DUE AT THE TIME OF SERVICE. Dental insurance, otherwise sliding scale - bring proof of income or support. Depending on income and treatment needed, cost is usually $50-200. Best way to get seen: Arrive early as it is first come/first served.     [facility] [phone]   Location: [address] Clinic Hours: Mon-Thu 8a-5p Services: Most basic dental services including extractions and fillings. Payment Options: PAYMENT IS DUE AT THE TIME OF SERVICE. Sliding scale, up to 50% off - bring proof if income or support. Medicaid with dental option accepted. Best way to get seen: Call to schedule an appointment, can usually be seen within 2 weeks OR they will try to see walk-ins - show up at 8a or 2p (you may have to wait).     [facility] [phone] [place] RESIDENTS ONLY   Location: [facility], [address], [place], [place] [address] Clinic Hours: By appointment only. Monday - Thursday 8am-5pm, Friday 8am-12pm Services: Cleanings, fillings, extractions. Payment Options: PAYMENT IS DUE AT THE TIME OF SERVICE. Cash, Visa or MasterCard. Sliding scale - $30 minimum per service. Best way to get seen: Come in to office, complete packet and make an appointment -   need proof of income or support monies for each household member and proof of [place]  residence. Usually takes about a month to get in.     [facility] [phone]   Location: [address] [place] Clinic Hours: Walk-in Urgent Care Dental Services are offered Monday-Friday mornings only. The numbers of emergencies accepted daily is limited to the number of providers available. Maximum 15 - Mondays, Wednesdays & Thursdays Maximum 10 - Tuesdays & Fridays Services: You do not need to be a [place] resident to be seen for a dental emergency. Emergencies are defined as pain, swelling, abnormal bleeding, or dental trauma. Walkins will receive x-rays if needed. NOTE: Dental cleaning is not an emergency. Payment Options: PAYMENT IS DUE AT THE TIME OF SERVICE. Minimum co-pay is $40.00 for uninsured patients. Minimum co-pay is $3.00 for Medicaid with dental coverage. Dental Insurance is accepted and must be presented at time of visit. Medicare does not cover dental. Forms of payment: Cash, credit card, checks. Best way to get seen: If not previously registered with the clinic, walk-in dental registration begins at 7:15 am and is on a first come/first serve basis. If previously registered with the clinic, call to make an appointment.     [facility] [phone] [place] RESIDENTS ONLY   Location: [address], [place], [place] Clinic Hours: Mon-Thu 10a-2p Services: Extractions only! Payment Options: FREE (donations accepted) - bring proof of income or support Best way to get seen: Call and schedule an appointment OR come at 8am on the 1st Monday of every month (except for holidays) when it is first come/first served.     [facility] [phone]   Location: [address], [place] Clinic Hours: Friday mornings Services, Payment Options, Best way to get seen: Call for info  

## 2017-10-28 NOTE — ED Notes (Signed)
Has tooth abscess for few days, but it has been hurting on and off for about 3 months.  Also had stitches in right index finger about a week ago.  He took them out himself.  Says he does have some numbness feeling distally.  The wound is well healed.

## 2017-10-28 NOTE — ED Provider Notes (Signed)
[facility] Emergency Department Provider Note ____________________________________________  Time seen: Approximately 5:54 PM  I have reviewed the triage vital signs and the nursing notes.   HISTORY  Chief Complaint Dental Pain   HPI Devin Barnes is a 37 y.o. male who presents to the emergency department for treatment and evaluation of dental pain. Pain is intermittent and has had multiple rounds of antibiotics for the same, but has not had any dental work. No relief with ibuprofen.   Past Medical History:  Diagnosis Date  . Hypertension     There are no active problems to display for this patient.   Past Surgical History:  Procedure Laterality Date  . ANAL FISSURE REPAIR      Prior to Admission medications   Medication Sig Start Date End Date Taking? Authorizing Provider  amoxicillin (AMOXIL) 500 MG tablet Take 1 tablet (500 mg total) by mouth 3 (three) times daily. 10/28/17   Saahas Hidrogo, Rulon Eisenmengerari B, FNP  HYDROcodone-acetaminophen (NORCO) 5-325 MG tablet Take 1 tablet by mouth every 6 (six) hours as needed for up to 7 doses for severe pain. 10/21/17   Merrily Brittleifenbark, Neil, MD  penicillin v potassium (VEETID) 500 MG tablet Take 1 tablet (500 mg total) by mouth 4 (four) times daily. 04/17/17   Menshew, Charlesetta IvoryJenise V Bacon, PA-C  traMADol (ULTRAM) 50 MG tablet Take 1 tablet (50 mg total) by mouth every 6 (six) hours as needed. 10/28/17   Chinita Pesterriplett, Romeka Scifres B, FNP    Allergies Patient has no known allergies.  History reviewed. No pertinent family history.  Social History Social History   Tobacco Use  . Smoking status: Current Every Day Smoker    Packs/day: 1.00    Types: Cigarettes  . Smokeless tobacco: Never Used  Substance Use Topics  . Alcohol use: No  . Drug use: Never    Review of Systems Constitutional: Negative for fever ENT: Positive for dental pain.  Musculoskeletal: Negative for trismus  Skin: Negative for erythema or facial  edema. ____________________________________________   PHYSICAL EXAM:  VITAL SIGNS: ED Triage Vitals [10/28/17 1540]  Enc Vitals Group     BP 114/79     Pulse Rate 87     Resp 16     Temp (!) 97.5 F (36.4 C)     Temp Source Oral     SpO2 99 %     Weight 300 lb (136.1 kg)     Height 5\' 11"  (1.803 m)     Head Circumference      Peak Flow      Pain Score 6     Pain Loc      Pain Edu?      Excl. in GC?     Constitutional: Alert and oriented. Well appearing and in no acute distress. Eyes: Conjunctivae are clear without discharge or drainage. Mouth/Throat: Airway is patent Periodontal Exam    Hematological/Lymphatic/Immunilogical: No lymphadenopathy Respiratory: Breath sounds clear to auscultation. Musculoskeletal: No trismus of the jaw. Neurologic: Awake, alert, and oriented x 4  Skin:  No erythema or facial edema Psychiatric: Affect and behavior is appropriate  ____________________________________________   LABS (all labs ordered are listed, but only abnormal results are displayed)  Labs Reviewed - No data to display ____________________________________________   RADIOLOGY  Not indicated ____________________________________________   PROCEDURES  Procedure(s) performed: Dental block performed with Lidocaine 2% with epi. Patient tolerated the procedure well. Good anesthesia and pain relief.  Critical Care performed: No ____________________________________________   INITIAL IMPRESSION /  ASSESSMENT AND PLAN / ED COURSE  Devin Barnes is a 37 y.o. male who presents to the emergency department for treatment of evaluation of dental pain. Dental block performed with good results and relief of pain. He will be given prescriptions for amoxicillin, 3 doses of norco, and naprosyn. He was advised to see the dentist as soon as possible.  Pertinent labs & imaging results that were available during my care of the patient were reviewed by me and considered in my medical  decision making (see chart for details).  ____________________________________________   FINAL CLINICAL IMPRESSION(S) / ED DIAGNOSES  Final diagnoses:  Chronic dental pain    Discharge Medication List as of 10/28/2017  6:14 PM      If controlled substance prescribed during this visit, 12 month history viewed on the [facility] prior to issuing an initial prescription for Schedule II or III opiod.  Note:  This document was prepared using Dragon voice recognition software and may include unintentional dictation errors.     Chinita Pester, FNP 10/28/17 Berna Spare    Minna Antis, MD 10/28/17 [phone]

## 2018-02-18 ENCOUNTER — Encounter: Payer: Self-pay | Admitting: Emergency Medicine

## 2018-02-18 ENCOUNTER — Other Ambulatory Visit: Payer: Self-pay

## 2018-02-18 ENCOUNTER — Emergency Department
Admission: EM | Admit: 2018-02-18 | Discharge: 2018-02-18 | Disposition: A | Discharge status: Home / Self Care | Payer: Self-pay | Attending: Emergency Medicine | Admitting: Emergency Medicine

## 2018-02-18 DIAGNOSIS — F17210 Nicotine dependence, cigarettes, uncomplicated: Secondary | ICD-10-CM | POA: Insufficient documentation

## 2018-02-18 DIAGNOSIS — K029 Dental caries, unspecified: Secondary | ICD-10-CM | POA: Insufficient documentation

## 2018-02-18 DIAGNOSIS — I10 Essential (primary) hypertension: Secondary | ICD-10-CM | POA: Insufficient documentation

## 2018-02-18 MED ORDER — AMOXICILLIN 500 MG PO CAPS: 500.0000 mg | ORAL_CAPSULE | Freq: Three times a day (TID) | ORAL | 0 refills | Status: DC

## 2018-02-18 NOTE — ED Triage Notes (Signed)
Pt to ED from home c/o right lower dental pain x2-3 days.

## 2018-02-18 NOTE — Discharge Instructions (Signed)
Take the antibiotics as directed. Follow-up with one of the local community clinics for definitive dental care.   OPTIONS FOR DENTAL FOLLOW UP CARE  [place] Department of Health and Human Services - Local Safety Net Dental Clinics [email or web address]   [facility] [phone])  Sharl Ma [phone])  [place] [phone] ext [phone])  [facility] [phone])  [facility] [phone]) This clinic caters to the indigent population and is on a lottery system. Location: [employer] [facility], [employer], [address] [place] Clinic Hours: Wednesdays from 6pm - 9pm, patients seen by a lottery system. For dates, call or go to [email or web address] Services: Cleanings, fillings and simple extractions. Payment Options: DENTAL WORK IS FREE OF CHARGE. Bring proof of income or support. Best way to get seen: Arrive at 5:15 pm - this is a lottery, NOT first come/first serve, so arriving earlier will not increase your chances of being seen.     [facility] [phone] Select option 1 for emergencies   Location: [facility], [place], [address] [place] Clinic Hours: No walk-ins accepted - call the day before to schedule an appointment. Check in times are 9:30 am and 1:30 pm. Services: Simple extractions, temporary fillings, pulpectomy/pulp debridement, uncomplicated abscess drainage. Payment Options: PAYMENT IS DUE AT THE TIME OF SERVICE.  Fee is usually $100-200, additional surgical procedures (e.g. abscess drainage) may be extra. Cash, checks, Visa/MasterCard accepted.  Can file Medicaid if patient is covered for dental - patient should call case worker to check. No discount for [facility] patients. Best way to get seen: MUST call the day before and get onto the schedule. Can  usually be seen the next 1-2 days. No walk-ins accepted.     [facility] [phone]   Location: [facility], [address] [place] Clinic Hours: M, W, Th, F 8am or 1:30pm, Tues 9a or 1:30 - first come/first served. Services: Simple extractions, temporary fillings, uncomplicated abscess drainage.  You do not need to be an [facility] resident. Payment Options: PAYMENT IS DUE AT THE TIME OF SERVICE. Dental insurance, otherwise sliding scale - bring proof of income or support. Depending on income and treatment needed, cost is usually $50-200. Best way to get seen: Arrive early as it is first come/first served.     [facility] [phone]   Location: [address] Clinic Hours: Mon-Thu 8a-5p Services: Most basic dental services including extractions and fillings. Payment Options: PAYMENT IS DUE AT THE TIME OF SERVICE. Sliding scale, up to 50% off - bring proof if income or support. Medicaid with dental option accepted. Best way to get seen: Call to schedule an appointment, can usually be seen within 2 weeks OR they will try to see walk-ins - show up at 8a or 2p (you may have to wait).     [facility] [phone] [place] RESIDENTS ONLY   Location: [facility], [address] [place], [place] [address] Clinic Hours: By appointment only. Monday - Thursday 8am-5pm, Friday 8am-12pm Services: Cleanings, fillings, extractions. Payment Options: PAYMENT IS DUE AT THE TIME OF SERVICE. Cash, Visa or MasterCard. Sliding scale - $30 minimum per service. Best way to get seen: Come in to office, complete packet and make an appointment - need proof of income or support monies for each household member and proof of [facility] residence. Usually takes about a month  to get in.     [facility] [phone]   Location: [address] [facility] Hours: Walk-in Urgent Care Dental Services are offered Monday-Friday mornings only. The numbers of emergencies accepted daily is limited to the number of providers available. Maximum 15 - Mondays, Wednesdays & Thursdays Maximum 10 - Tuesdays & Fridays Services: You do not need to be a [facility] resident to be seen for a dental emergency. Emergencies are defined as pain, swelling, abnormal bleeding, or dental trauma. Walkins will receive x-rays if needed. NOTE: Dental cleaning is not an emergency. Payment Options: PAYMENT IS DUE AT THE TIME OF SERVICE. Minimum co-pay is $40.00 for uninsured patients. Minimum co-pay is $3.00 for Medicaid with dental coverage. Dental Insurance is accepted and must be presented at time of visit. Medicare does not cover dental. Forms of payment: Cash, credit card, checks. Best way to get seen: If not previously registered with the clinic, walk-in dental registration begins at 7:15 am and is on a first come/first serve basis. If previously registered with the clinic, call to make an appointment.     [facility] [phone] [place] RESIDENTS ONLY   Location: [address], [place], [place] Clinic Hours: Mon-Thu 10a-2p Services: Extractions only! Payment Options: FREE (donations accepted) - bring proof of income or support Best way to get seen: Call and schedule an appointment OR come at 8am on the 1st Monday of every month (except for holidays) when it is first come/first served.     [facility] [phone]   Location: [address], [place] Clinic Hours: Friday mornings Services, Payment Options, Best way to get seen: Call for info

## 2018-02-18 NOTE — ED Provider Notes (Signed)
[facility] Emergency Department Provider Note ____________________________________________  Time seen: 1450  I have reviewed the triage vital signs and the nursing notes.  HISTORY History and exam per S. Defillipo, PA-S Sherrie Sport)  Chief Complaint  Dental Pain  HPI Devin Barnes is a 37 y.o. male presents to the ED for evaluation of dental pain and chronic dental caries.  Patient is been seen in the ED on previous occasions for similar complaints.  He reports pain over the last 2 to 3 days.  Denies any fevers, chills, sweats, chest pain, or shortness of breath.  The patient denies any difficulty swallowing, breathing, controlling oral secretions.  He denies any facial or gum swelling.  Patient denies any interim evaluation by dental provider.  Past Medical History:  Diagnosis Date  . Hypertension     There are no active problems to display for this patient.   Past Surgical History:  Procedure Laterality Date  . ANAL FISSURE REPAIR      Prior to Admission medications   Medication Sig Start Date End Date Taking? Authorizing Provider  amoxicillin (AMOXIL) 500 MG capsule Take 1 capsule (500 mg total) by mouth 3 (three) times daily. 02/18/18   Jim Philemon, Charlesetta Ivory, PA-C    Allergies Patient has no known allergies.  History reviewed. No pertinent family history.  Social History Social History   Tobacco Use  . Smoking status: Current Every Day Smoker    Packs/day: 1.00    Types: Cigarettes  . Smokeless tobacco: Never Used  Substance Use Topics  . Alcohol use: No  . Drug use: Never    Review of Systems  Constitutional: Negative for fever. Eyes: Negative for visual changes. ENT: Negative for sore throat.  Dental pain as above. Cardiovascular: Negative for chest pain. Respiratory: Negative for shortness of breath. Gastrointestinal: Negative for abdominal pain, vomiting and diarrhea. Musculoskeletal: Negative for back pain. Skin: Negative for  rash. Neurological: Negative for headaches, focal weakness or numbness. ____________________________________________  PHYSICAL EXAM:  VITAL SIGNS: ED Triage Vitals [02/18/18 1405]  Enc Vitals Group     BP (!) 168/131     Pulse Rate 80     Resp 16     Temp 97.7 F (36.5 C)     Temp Source Oral     SpO2 99 %     Weight 300 lb (136.1 kg)     Height 5\' 11"  (1.803 m)     Head Circumference      Peak Flow      Pain Score 10     Pain Loc      Pain Edu?      Excl. in GC?     Constitutional: Alert and oriented. Well appearing and in no distress. Head: Normocephalic and atraumatic. Eyes: Conjunctivae are normal. PERRL. Normal extraocular movements Ears: Canals clear. TMs intact bilaterally. Nose: No congestion/rhinorrhea/epistaxis. Mouth/Throat: Mucous membranes are moist. Neck: Supple. No thyromegaly. Hematological/Lymphatic/Immunological: No cervical lymphadenopathy. Cardiovascular: Normal rate, regular rhythm. Normal distal pulses. Respiratory: Normal respiratory effort. No wheezes/rales/rhonchi. Musculoskeletal: Nontender with normal range of motion in all extremities.  Neurologic:  Normal gait without ataxia. Normal speech and language. No gross focal neurologic deficits are appreciated. Skin:  Skin is warm, dry and intact. No rash noted. Psychiatric: Mood and affect are normal. Patient exhibits appropriate insight and judgment. ____________________________________________  INITIAL IMPRESSION / ASSESSMENT AND PLAN / ED COURSE  Patient with ED evaluation of chronic dental caries and acute dental pain.  Patient was treated empirically  for dental infection.  He is given a list of dental providers for which he should contact definitive treatment.  Patient was also advised that he should attempt to make contact with a dental provider with one the local clinics who are also able to provide antibiotic coverage as necessary.  Patient is discharged with a prescription for amoxicillin.   He should return to the ED as needed. ____________________________________________  FINAL CLINICAL IMPRESSION(S) / ED DIAGNOSES  Final diagnoses:  Dental caries  Pain due to dental caries      Lissa HoardMenshew, Kortnee Bas V Bacon, PA-C 02/18/18 1913    Merrily Brittleifenbark, Neil, MD 02/19/18 1910

## 2018-02-18 NOTE — ED Notes (Signed)
Pt c/o right upper tooth pain for the past several months, worse in the past couple of days. States he does not have a Education officer, communitydentist or insurance explained to the pt we will give him referrals.

## 2019-07-05 ENCOUNTER — Emergency Department
Admission: EM | Admit: 2019-07-05 | Discharge: 2019-07-05 | Disposition: A | Discharge status: Home / Self Care | Payer: Self-pay | Attending: Emergency Medicine | Admitting: Emergency Medicine

## 2019-07-05 ENCOUNTER — Encounter: Payer: Self-pay | Admitting: Medical Oncology

## 2019-07-05 ENCOUNTER — Other Ambulatory Visit: Payer: Self-pay

## 2019-07-05 DIAGNOSIS — K0889 Other specified disorders of teeth and supporting structures: Secondary | ICD-10-CM | POA: Insufficient documentation

## 2019-07-05 DIAGNOSIS — Z91199 Patient's noncompliance with other medical treatment and regimen due to unspecified reason: Secondary | ICD-10-CM

## 2019-07-05 DIAGNOSIS — Z9114 Patient's other noncompliance with medication regimen: Secondary | ICD-10-CM | POA: Insufficient documentation

## 2019-07-05 DIAGNOSIS — Z9119 Patient's noncompliance with other medical treatment and regimen: Secondary | ICD-10-CM

## 2019-07-05 DIAGNOSIS — F17210 Nicotine dependence, cigarettes, uncomplicated: Secondary | ICD-10-CM | POA: Insufficient documentation

## 2019-07-05 DIAGNOSIS — Z79899 Other long term (current) drug therapy: Secondary | ICD-10-CM | POA: Insufficient documentation

## 2019-07-05 DIAGNOSIS — K029 Dental caries, unspecified: Secondary | ICD-10-CM

## 2019-07-05 DIAGNOSIS — I10 Essential (primary) hypertension: Secondary | ICD-10-CM | POA: Insufficient documentation

## 2019-07-05 MED ORDER — AMOXICILLIN 875 MG PO TABS: 875.0000 mg | ORAL_TABLET | Freq: Two times a day (BID) | ORAL | 0 refills | Status: DC

## 2019-07-05 MED ORDER — HYDROCHLOROTHIAZIDE 25 MG PO TABS: 25.0000 mg | ORAL_TABLET | Freq: Every day | ORAL | 0 refills | Status: AC

## 2019-07-05 MED ORDER — TRAMADOL HCL 50 MG PO TABS: 50.0000 mg | ORAL_TABLET | Freq: Four times a day (QID) | ORAL | 0 refills | Status: DC | PRN

## 2019-07-05 NOTE — ED Notes (Signed)
See triage note   Presents with possible dental abscess  having pain to right upper gum line

## 2019-07-05 NOTE — ED Triage Notes (Signed)
Rt upper dental pain x 3 days.

## 2019-07-05 NOTE — Discharge Instructions (Addendum)
Follow-up with one of the dental clinics listed on your discharge papers.  [facility] dental [facility] also has walk-in hours.  A separate paper is given to you with these walk-in hours on it.  Also your blood pressure is not controlled.  Your initial blood pressure was 186/119.  This is dangerously high and could cause stroke or heart attack.  [facility] is listed on your discharge papers.  You may go to the open-door clinic to establish a primary care provider and also continue to control your blood pressure.  Uncontrolled blood pressure can also cause damage to your eyes, kidneys and vascular system.  The blood pressure medication prescribed for you today is not the blood pressure medication that you will need to be on long-term.  See 1 of these clinics as soon as possible.  OPTIONS FOR DENTAL FOLLOW UP CARE  [place] Department of Health and Human Services - Local Safety Net Dental Clinics [email or web address]   [facility] [phone])  Sharl Ma [phone])  [place] [phone] ext [phone])  [facility] [phone])  [facility] [phone]) This clinic caters to the indigent population and is on a lottery system. Location: [employer] [facility], [employer], [address] [place] Clinic Hours: Wednesdays from 6pm - 9pm, patients seen by a lottery system. For dates, call or go to [email or web address] Services: Cleanings, fillings and simple extractions. Payment Options: DENTAL WORK IS FREE OF CHARGE. Bring proof of income or support. Best way to get seen: Arrive at 5:15 pm - this is a lottery, NOT first come/first serve, so arriving earlier will not increase your chances of being seen.     [facility] [phone] Select option 1 for emergencies   Location: [facility], [place],  [address], [place] Clinic Hours: No walk-ins accepted - call the day before to schedule an appointment. Check in times are 9:30 am and 1:30 pm. Services: Simple extractions, temporary fillings, pulpectomy/pulp debridement, uncomplicated abscess drainage. Payment Options: PAYMENT IS DUE AT THE TIME OF SERVICE.  Fee is usually $100-200, additional surgical procedures (e.g. abscess drainage) may be extra. Cash, checks, Visa/MasterCard accepted.  Can file Medicaid if patient is covered for dental - patient should call case worker to check. No discount for [facility] patients. Best way to get seen: MUST call the day before and get onto the schedule. Can usually be seen the next 1-2 days. No walk-ins accepted.     [facility] [phone]   Location: [facility], [address], [place] Clinic Hours: M, W, Th, F 8am or 1:30pm, Tues 9a or 1:30 - first come/first served. Services: Simple extractions, temporary fillings, uncomplicated abscess drainage.  You do not need to be an [facility] resident. Payment Options: PAYMENT IS DUE AT THE TIME OF SERVICE. Dental insurance, otherwise sliding scale - bring proof of income or support. Depending on income and treatment needed, cost is usually $50-200. Best way to get seen: Arrive early as it is first come/first served.     [facility] [phone]   Location: [address] Clinic Hours: Mon-Thu 8a-5p Services: Most basic dental services including extractions and fillings. Payment Options: PAYMENT IS DUE AT THE TIME OF SERVICE. Sliding scale, up to 50% off - bring proof if income or support. Medicaid with dental option accepted. Best way to get seen: Call to schedule an appointment, can  usually be seen within 2 weeks OR they will try to see walk-ins - show up at [place] or 2p (you may have to wait).     [facility] [place] RESIDENTS ONLY   Location: [facility], [place] [address], [place], [place] [address] Clinic Hours: By appointment only. Monday - Thursday 8am-5pm, Friday 8am-12pm Services: Cleanings, fillings, extractions. Payment Options: PAYMENT IS DUE AT THE TIME OF SERVICE. Cash, Visa or MasterCard. Sliding scale - $30 minimum per service. Best way to get seen: Come in to office, complete packet and make an appointment - need proof of income or support monies for each household member and proof of [facility] residence. Usually takes about a month to get in.     [facility] [phone]   Location: [address], [place] Clinic Hours: Walk-in Urgent Care Dental Services are offered Monday-Friday mornings only. The numbers of emergencies accepted daily is limited to the number of providers available. Maximum 15 - Mondays, Wednesdays & Thursdays Maximum 10 - Tuesdays & Fridays Services: You do not need to be a [facility] resident to be seen for a dental emergency. Emergencies are defined as pain, swelling, abnormal bleeding, or dental trauma. Walkins will receive x-rays if needed. NOTE: Dental cleaning is not an emergency. Payment Options: PAYMENT IS DUE AT THE TIME OF SERVICE. Minimum co-pay is $40.00 for uninsured patients. Minimum co-pay is $3.00 for Medicaid with dental coverage. Dental Insurance is accepted and must be presented at time of visit. Medicare does not cover dental. Forms of payment: Cash, credit card, checks. Best way to get seen: If not previously registered with the clinic, walk-in dental registration begins at 7:15 am and is on a first come/first serve basis. If previously registered with the clinic, call to make an appointment.     [facility] [place] ONLY   Location: [address] [place], [place] Clinic Hours: Mon-Thu  10a-2p Services: Extractions only! Payment Options: FREE (donations accepted) - bring proof of income or support Best way to get seen: Call and schedule an appointment OR come at 8am on the 1st Monday of every month (except for holidays) when it is first come/first served.     [facility] [phone]   Location: [place], [place] Clinic Hours: Friday mornings Services, Payment Options, Best way to get seen: Call for info

## 2019-07-05 NOTE — ED Provider Notes (Signed)
[facility] Emergency Department Provider Note   ____________________________________________   First MD Initiated Contact with Patient 07/05/19 1246     (approximate)  I have reviewed the triage vital signs and the nursing notes.   HISTORY  Chief Complaint Dental Pain   HPI Devin Barnes is a 38 y.o. male presents to the ED with complaint of right upper dental pain.  Patient states that this began 3 days ago.  He states he has chronic problems with his teeth and not been to see a dentist.  He states that his teeth have been intermittently giving him problems for over a year.  He also has a history of hypertension but has not seen a doctor in over a year and reports that it has been that long since he had his blood pressure medication.  He denies any headache, dizziness, shortness of breath, chest pain or diaphoresis.  Patient continues to smoke 1 pack cigarettes per day.  Currently rates his pain as a 10/10.       Past Medical History:  Diagnosis Date  . Hypertension     There are no active problems to display for this patient.   Past Surgical History:  Procedure Laterality Date  . ANAL FISSURE REPAIR      Prior to Admission medications   Medication Sig Start Date End Date Taking? Authorizing Provider  amoxicillin (AMOXIL) 875 MG tablet Take 1 tablet (875 mg total) by mouth 2 (two) times daily. 07/05/19   Johnn Hai, PA-C  hydrochlorothiazide (HYDRODIURIL) 25 MG tablet Take 1 tablet (25 mg total) by mouth daily. 07/05/19   Johnn Hai, PA-C  traMADol (ULTRAM) 50 MG tablet Take 1 tablet (50 mg total) by mouth every 6 (six) hours as needed. 07/05/19   Johnn Hai, PA-C    Allergies Patient has no known allergies.  No family history on file.  Social History Social History   Tobacco Use  . Smoking status: Current Every Day Smoker    Packs/day: 1.00    Types: Cigarettes  . Smokeless tobacco: Never Used  Substance Use Topics   . Alcohol use: No  . Drug use: Never    Review of Systems Constitutional: No fever/chills Eyes: No visual changes. ENT: Positive for dental pain. Cardiovascular: Denies chest pain. Respiratory: Denies shortness of breath. Musculoskeletal: Negative for back pain. Skin: Negative for rash. Neurological: Negative for headaches, focal weakness or numbness. ___________________________________________   PHYSICAL EXAM:  VITAL SIGNS: ED Triage Vitals  Enc Vitals Group     BP 07/05/19 1237 (!) 186/119     Pulse Rate 07/05/19 1237 80     Resp 07/05/19 1237 (!) 22     Temp 07/05/19 1237 98.9 F (37.2 C)     Temp Source 07/05/19 1237 Oral     SpO2 07/05/19 1237 99 %     Weight 07/05/19 1233 299 lb 13.2 oz (136 kg)     Height --      Head Circumference --      Peak Flow --      Pain Score 07/05/19 1233 10     Pain Loc --      Pain Edu? --      Excl. in [place]? --     Constitutional: Alert and oriented. Well appearing and in no acute distress.  Eyes: Conjunctivae are normal.  Head: Atraumatic. Nose: No congestion/rhinnorhea. Mouth/Throat: Mucous membranes are moist.  Oropharynx non-erythematous.  Multiple dental cavities noted throughout his  mouth with most of his teeth black and moderately eroded.  Dental pain at this time is right upper premolar to molar area with these teeth seriously in need of dental care.  No active drainage noted at this time. Neck: No stridor.   Cardiovascular: Normal rate, regular rhythm. Grossly normal heart sounds.  Good peripheral circulation. Respiratory: Normal respiratory effort.  No retractions. Lungs CTAB. Musculoskeletal: Moves upper and lower extremities without any difficulty.  Normal gait was noted. Neurologic:  Normal speech and language. No gross focal neurologic deficits are appreciated. No gait instability. Skin:  Skin is warm, dry and intact.  Psychiatric: Mood and affect are normal. Speech and behavior are normal.   ____________________________________________   LABS (all labs ordered are listed, but only abnormal results are displayed)  Labs Reviewed - No data to display  RADIOLOGY   Official radiology report(s): No results found.  ____________________________________________   PROCEDURES  Procedure(s) performed (including Critical Care):  Procedures  ____________________________________________   INITIAL IMPRESSION / ASSESSMENT AND PLAN / ED COURSE  As part of my medical decision making, I reviewed the following data within the electronic MEDICAL RECORD NUMBER Notes from prior ED visits and  Controlled Substance Database  38 year old male presents to the ED with complaint of dental abscess.  Patient states that this particular tooth has been bothering him for more than 1 year and comes and goes after he takes antibiotics.  Patient also is hypertensive and states that he has not taken his blood pressure medication in over 1 year.  He states that he did not follow-up with his PCP.  He denies any headache, dizziness, visual changes, shortness of breath or chest pain.  Patient continues to smoke 1 pack cigarettes per day.  Examination of his mouth shows multiple caries and extremely poor dental hygiene.  Patient was given a prescription for amoxicillin and 2 days of tramadol.  Patient also was given a prescription for hydrochlorothiazide 25 mg daily.  Patient was made aware that this medication is not the medication that he should be on and that it is extremely important that he follow-up with a doctor for his blood pressure.  We also discussed his higher risk for stroke and heart attack due to his blood pressure.  Patient was given a list of clinics including [facility] that he can obtain a primary care provider.  He was also given a list of dental clinics in the area including the walk-in hours at [facility].  ____________________________________________   FINAL CLINICAL  IMPRESSION(S) / ED DIAGNOSES  Final diagnoses:  Pain due to dental caries  Hypertension, uncontrolled  Medically noncompliant     ED Discharge Orders         Ordered    amoxicillin (AMOXIL) 875 MG tablet  2 times daily     07/05/19 1351    traMADol (ULTRAM) 50 MG tablet  Every 6 hours PRN     07/05/19 1351    hydrochlorothiazide (HYDRODIURIL) 25 MG tablet  Daily     07/05/19 1351           Note:  This document was prepared using Dragon voice recognition software and may include unintentional dictation errors.    Tommi Rumps, PA-C 07/05/19 1453    Chesley Noon, MD 07/06/19 [phone]

## 2020-03-14 ENCOUNTER — Emergency Department
Admission: EM | Admit: 2020-03-14 | Discharge: 2020-03-14 | Disposition: A | Discharge status: Home / Self Care | Payer: Self-pay | Attending: Emergency Medicine | Admitting: Emergency Medicine

## 2020-03-14 ENCOUNTER — Emergency Department: Payer: Self-pay

## 2020-03-14 ENCOUNTER — Encounter: Payer: Self-pay | Admitting: Emergency Medicine

## 2020-03-14 ENCOUNTER — Other Ambulatory Visit: Payer: Self-pay

## 2020-03-14 DIAGNOSIS — L03319 Cellulitis of trunk, unspecified: Secondary | ICD-10-CM

## 2020-03-14 DIAGNOSIS — Z79899 Other long term (current) drug therapy: Secondary | ICD-10-CM | POA: Insufficient documentation

## 2020-03-14 DIAGNOSIS — F17210 Nicotine dependence, cigarettes, uncomplicated: Secondary | ICD-10-CM | POA: Insufficient documentation

## 2020-03-14 DIAGNOSIS — I10 Essential (primary) hypertension: Secondary | ICD-10-CM | POA: Insufficient documentation

## 2020-03-14 DIAGNOSIS — L03316 Cellulitis of umbilicus: Secondary | ICD-10-CM | POA: Insufficient documentation

## 2020-03-14 LAB — CBC WITH DIFFERENTIAL/PLATELET
Abs Immature Granulocytes: 0.04 10*3/uL (ref 0.00–0.07)
Basophils Absolute: 0.1 10*3/uL (ref 0.0–0.1)
Basophils Relative: 1 %
Eosinophils Absolute: 0.4 10*3/uL (ref 0.0–0.5)
Eosinophils Relative: 4 %
HCT: 42.1 % (ref 39.0–52.0)
Hemoglobin: 15 g/dL (ref 13.0–17.0)
Immature Granulocytes: 0 %
Lymphocytes Relative: 32 %
Lymphs Abs: 3.4 10*3/uL (ref 0.7–4.0)
MCH: 31.8 pg (ref 26.0–34.0)
MCHC: 35.6 g/dL (ref 30.0–36.0)
MCV: 89.4 fL (ref 80.0–100.0)
Monocytes Absolute: 1 10*3/uL (ref 0.1–1.0)
Monocytes Relative: 10 %
Neutro Abs: 5.6 10*3/uL (ref 1.7–7.7)
Neutrophils Relative %: 53 %
Platelets: 297 10*3/uL (ref 150–400)
RBC: 4.71 MIL/uL (ref 4.22–5.81)
RDW: 12.2 % (ref 11.5–15.5)
WBC: 10.4 10*3/uL (ref 4.0–10.5)
nRBC: 0 % (ref 0.0–0.2)

## 2020-03-14 LAB — COMPREHENSIVE METABOLIC PANEL
ALT: 20 U/L (ref 0–44)
AST: 19 U/L (ref 15–41)
Albumin: 4 g/dL (ref 3.5–5.0)
Alkaline Phosphatase: 75 U/L (ref 38–126)
Anion gap: 9 (ref 5–15)
BUN: 14 mg/dL (ref 6–20)
CO2: 25 mmol/L (ref 22–32)
Calcium: 8.6 mg/dL — ABNORMAL LOW (ref 8.9–10.3)
Chloride: 101 mmol/L (ref 98–111)
Creatinine, Ser: 0.74 mg/dL (ref 0.61–1.24)
GFR calc Af Amer: >60 mL/min (ref >60)
GFR calc non Af Amer: >60 mL/min (ref >60)
Glucose, Bld: 145 mg/dL — ABNORMAL HIGH (ref 70–99)
Potassium: 3.8 mmol/L (ref 3.5–5.1)
Sodium: 135 mmol/L (ref 135–145)
Total Bilirubin: 0.6 mg/dL (ref 0.3–1.2)
Total Protein: 7.1 g/dL (ref 6.5–8.1)

## 2020-03-14 LAB — LIPASE, BLOOD: Lipase: 69 U/L — ABNORMAL HIGH (ref 11–51)

## 2020-03-14 MED ORDER — DOXYCYCLINE HYCLATE 100 MG PO CAPS: 100.0000 mg | ORAL_CAPSULE | Freq: Two times a day (BID) | ORAL | 0 refills | Status: AC

## 2020-03-14 MED ORDER — IOHEXOL 300 MG/ML  SOLN
125.0000 mL | Freq: Once | INTRAMUSCULAR | Status: AC | PRN
  Administered 2020-03-14: 125 mL via INTRAVENOUS

## 2020-03-14 MED ORDER — KETOROLAC TROMETHAMINE 30 MG/ML IJ SOLN
30.0000 mg | Freq: Once | INTRAMUSCULAR | Status: AC
  Administered 2020-03-14: 30 mg via INTRAVENOUS
  Filled 2020-03-14: qty 1

## 2020-03-14 MED ORDER — FENTANYL CITRATE (PF) 100 MCG/2ML IJ SOLN
50.0000 ug | Freq: Once | INTRAMUSCULAR | Status: AC
  Administered 2020-03-14: 50 ug via INTRAVENOUS
  Filled 2020-03-14: qty 2

## 2020-03-14 MED ORDER — LACTATED RINGERS IV BOLUS
1000.0000 mL | Freq: Once | INTRAVENOUS | Status: AC
  Administered 2020-03-14: 1000 mL via INTRAVENOUS

## 2020-03-14 NOTE — ED Triage Notes (Signed)
Patient ambulatory to triage with steady gait, without difficulty or distress noted; pt reports umbilical pain since last night with no accomp symptoms; st "feels like a knot"

## 2020-03-14 NOTE — ED Provider Notes (Signed)
[facility] Emergency Department Provider Note  ____________________________________________   First MD Initiated Contact with Patient 03/14/20 [id number]     (approximate)  I have reviewed the triage vital signs and the nursing notes.   HISTORY  Chief Complaint Abdominal Pain   HPI Devin Barnes is a 39 y.o. male with past medical history of HTN, tobacco abuse, and previous anal fissure who presents for assessment of pain around his umbilicus that woke him from sleep last night.  Patient states he is not sure if it is an abscess or hernia.  States he did not notice any drainage until he got to emergency room.  No prior similar episodes although he states he has had an abscess on his gluteus before.  No fevers, vomiting, diarrhea, dysuria, chest pain, cough, shortness of breath, rash, or other areas of pain.  No clear alleviating aggravating factors.  Patient denies any recent traumatic injuries, EtOH use, or illegal drug use.  No prior similar episodes around his umbilicus.  No history of hernia.         Past Medical History:  Diagnosis Date  . Hypertension     There are no problems to display for this patient.   Past Surgical History:  Procedure Laterality Date  . ANAL FISSURE REPAIR      Prior to Admission medications   Medication Sig Start Date End Date Taking? Authorizing Provider  doxycycline (VIBRAMYCIN) 100 MG capsule Take 1 capsule (100 mg total) by mouth 2 (two) times daily for 10 days. 03/14/20 03/24/20  Gilles Chiquito, MD  hydrochlorothiazide (HYDRODIURIL) 25 MG tablet Take 1 tablet (25 mg total) by mouth daily. 07/05/19   Tommi Rumps, PA-C  traMADol (ULTRAM) 50 MG tablet Take 1 tablet (50 mg total) by mouth every 6 (six) hours as needed. 07/05/19   Tommi Rumps, PA-C    Allergies Patient has no known allergies.  No family history on file.  Social History Social History   Tobacco Use  . Smoking status: Current Every Day  Smoker    Packs/day: 1.00    Types: Cigarettes  . Smokeless tobacco: Never Used  Substance Use Topics  . Alcohol use: No  . Drug use: Never    Review of Systems  Review of Systems  Constitutional: Negative for chills and fever.  HENT: Negative for sore throat.   Eyes: Negative for pain.  Respiratory: Negative for cough and stridor.   Cardiovascular: Negative for chest pain.  Gastrointestinal: Positive for abdominal pain. Negative for vomiting.  Skin: Negative for rash.  Neurological: Negative for seizures, loss of consciousness and headaches.  Psychiatric/Behavioral: Negative for suicidal ideas.  All other systems reviewed and are negative.     ____________________________________________   PHYSICAL EXAM:  VITAL SIGNS: ED Triage Vitals  Enc Vitals Group     BP 03/14/20 0638 (!) 166/100     Pulse Rate 03/14/20 0638 99     Resp 03/14/20 0638 18     Temp 03/14/20 0638 98.3 F (36.8 C)     Temp Source 03/14/20 0638 Oral     SpO2 03/14/20 0638 100 %     Weight 03/14/20 0639 300 lb (136.1 kg)     Height 03/14/20 0639 5\' 10"  (1.778 m)     Head Circumference --      Peak Flow --      Pain Score 03/14/20 0638 6     Pain Loc --      Pain Edu? --  Excl. in GC? --    Vitals:   03/14/20 0914 03/14/20 1006  BP: (!) 130/97   Pulse: 90 88  Resp: 16   Temp:    SpO2: 98% 98%   Physical Exam Vitals and nursing note reviewed.  Constitutional:      Appearance: He is well-developed.  HENT:     Head: Normocephalic and atraumatic.     Right Ear: External ear normal.     Left Ear: External ear normal.     Nose: Nose normal.     Mouth/Throat:     Mouth: Mucous membranes are moist.  Eyes:     Conjunctiva/sclera: Conjunctivae normal.  Cardiovascular:     Rate and Rhythm: Normal rate and regular rhythm.     Pulses: Normal pulses.     Heart sounds: No murmur heard.   Pulmonary:     Effort: Pulmonary effort is normal. No respiratory distress.     Breath sounds:  Normal breath sounds.  Abdominal:     Palpations: Abdomen is soft.     Tenderness: There is abdominal tenderness in the periumbilical area.  Musculoskeletal:     Cervical back: Neck supple.  Skin:    General: Skin is warm and dry.     Capillary Refill: Capillary refill takes less than 2 seconds.  Neurological:     Mental Status: He is alert and oriented to person, place, and time.  Psychiatric:        Mood and Affect: Mood normal.     There is some scant purulent drainage at the umbilicus. ____________________________________________   LABS (all labs ordered are listed, but only abnormal results are displayed)  Labs Reviewed  COMPREHENSIVE METABOLIC PANEL - Abnormal; Notable for the following components:      Result Value   Glucose, Bld 145 (*)    Calcium 8.6 (*)    All other components within normal limits  LIPASE, BLOOD - Abnormal; Notable for the following components:   Lipase 69 (*)    All other components within normal limits  AEROBIC CULTURE (SUPERFICIAL SPECIMEN)  CBC WITH DIFFERENTIAL/PLATELET  URINALYSIS, COMPLETE (UACMP) WITH MICROSCOPIC   ____________________________________________ ________________________________________  RADIOLOGY   Official radiology report(s): CT ABDOMEN PELVIS W CONTRAST  Result Date: 03/14/2020 CLINICAL DATA:  Onset umbilical pain last night.  No known injury. EXAM: CT ABDOMEN AND PELVIS WITH CONTRAST TECHNIQUE: Multidetector CT imaging of the abdomen and pelvis was performed using the standard protocol following bolus administration of intravenous contrast. CONTRAST:  125 mL OMNIPAQUE IOHEXOL 300 MG/ML  SOLN COMPARISON:  None. FINDINGS: Lower chest: Lung bases clear.  No pleural or pericardial effusion. Hepatobiliary: No focal liver abnormality is seen. No gallstones, gallbladder wall thickening, or biliary dilatation. Pancreas: Unremarkable. No pancreatic ductal dilatation or surrounding inflammatory changes. Spleen: Normal in size  without focal abnormality. Adrenals/Urinary Tract: Adrenal glands are unremarkable. Kidneys are normal, without renal calculi, focal lesion, or hydronephrosis. Bladder is unremarkable. 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 Stomach/Bowel: Stomach is within normal limits. Appendix appears normal. No evidence of bowel wall thickening, distention, or inflammatory changes. Vascular/Lymphatic: Aortic atherosclerosis. No enlarged abdominal or pelvic lymph nodes. Reproductive: Prostate is unremarkable. Other: There is extensive infiltration of subcutaneous fat about the umbilicus. No focal fluid collection is identified. Musculoskeletal: No acute bony abnormality. Degenerative disc disease is present at L4-5 and L5-S1. There is approximately 0.4 cm retrolisthesis L4 on L5 and L5 on S1 IMPRESSION: Periumbilical cellulitis without  abscess. 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 No other acute abnormality. Lower lumbar degenerative disease. Aortic Atherosclerosis (ICD10-I70.0). Electronically Signed   By: Drusilla Kanner M.D.   On: 03/14/2020 11:27    ____________________________________________   PROCEDURES  Procedure(s) performed (including Critical Care):  Procedures   ____________________________________________   INITIAL IMPRESSION / ASSESSMENT AND PLAN / ED COURSE        Overall patient's history, exam, and ED work-up is most consistent with periumbilical cellulitis as noted above CT.  No evidence of abscess, acute pancreatitis, cholestatic process, appendicitis, kidney stone, or other acute intra-abdominal pathology.  No evidence of hernia.  Culture from purulent fluid obtained.  Rx written for doxycycline.  Patient discharged stable condition.  Strict return precautions advised and discussed.  Instructed to follow-up with PCP in 5 to 7 days for wound recheck and have his blood pressure rechecked  as well.  Medications  ketorolac (TORADOL) 30 MG/ML injection 30 mg (has no administration in time range)  lactated ringers bolus 1,000 mL (1,000 mLs Intravenous New Bag/Given 03/14/20 0959)  fentaNYL (SUBLIMAZE) injection 50 mcg (50 mcg Intravenous Given 03/14/20 1001)  iohexol (OMNIPAQUE) 300 MG/ML solution 125 mL (125 mLs Intravenous Contrast Given 03/14/20 1048)    ____________________________________________   FINAL CLINICAL IMPRESSION(S) / ED DIAGNOSES  Final diagnoses:  Cellulitis of periumbilical region     ED Discharge Orders         Ordered    doxycycline (VIBRAMYCIN) 100 MG capsule  2 times daily     Discontinue  Reprint     03/14/20 1155           Note:  This document was prepared using Dragon voice recognition software and may include unintentional dictation errors.   Gilles Chiquito, MD 03/14/20 1159

## 2020-03-16 LAB — AEROBIC CULTURE W GRAM STAIN (SUPERFICIAL SPECIMEN)

## 2020-03-16 LAB — AEROBIC CULTURE  (SUPERFICIAL SPECIMEN): Special Requests: NORMAL

## 2020-03-17 NOTE — Progress Notes (Signed)
ED Antimicrobial Stewardship Positive Culture Follow Up   Devin Barnes is an 39 y.o. male who presented to [facility] on 03/14/2020 with a chief complaint of  Chief Complaint  Patient presents with   Abdominal Pain    Recent Results (from the past 720 hour(s))  Wound or Superficial Culture     Status: None   Collection Time: 03/14/20 10:08 AM   Specimen: Abdomen; Wound  Result Value Ref Range Status   Specimen Description   Final    ABDOMEN Performed at [facility], [address] [place], [place] [address]    Special Requests   Final    Normal Performed at [facility], [address] [place], [place] [address]    Gram Stain   Final    FEW WBC PRESENT, PREDOMINANTLY PMN FEW GRAM POSITIVE COCCI IN PAIRS FEW GRAM POSITIVE RODS Performed at [facility], [address], [place], [place] [address]    Culture FEW METHICILLIN RESISTANT STAPHYLOCOCCUS AUREUS  Final   Report Status 03/16/2020 FINAL  Final   Organism ID, Bacteria METHICILLIN RESISTANT STAPHYLOCOCCUS AUREUS  Final      Susceptibility   Methicillin resistant staphylococcus aureus - MIC*    CIPROFLOXACIN <=0.5 SENSITIVE Sensitive     ERYTHROMYCIN <=0.25 SENSITIVE Sensitive     GENTAMICIN <=0.5 SENSITIVE Sensitive     OXACILLIN RESISTANT Resistant     TETRACYCLINE <=1 SENSITIVE Sensitive     VANCOMYCIN 1 SENSITIVE Sensitive     TRIMETH/SULFA <=10 SENSITIVE Sensitive     CLINDAMYCIN <=0.25 SENSITIVE Sensitive     RIFAMPIN <=0.5 SENSITIVE Sensitive     Inducible Clindamycin NEGATIVE Sensitive     * FEW METHICILLIN RESISTANT STAPHYLOCOCCUS AUREUS   Periumbilical cellulitits []  Patient discharged with doxycyline which appears should cover organism    [name] 03/17/2020, 12:44 PM Clinical Pharmacist

## 2020-05-18 ENCOUNTER — Other Ambulatory Visit: Payer: Self-pay

## 2020-05-18 ENCOUNTER — Emergency Department
Admission: EM | Admit: 2020-05-18 | Discharge: 2020-05-18 | Disposition: A | Discharge status: Home / Self Care | Payer: Self-pay | Attending: Emergency Medicine | Admitting: Emergency Medicine

## 2020-05-18 ENCOUNTER — Emergency Department: Payer: Self-pay

## 2020-05-18 ENCOUNTER — Encounter: Payer: Self-pay | Admitting: Intensive Care

## 2020-05-18 DIAGNOSIS — F17210 Nicotine dependence, cigarettes, uncomplicated: Secondary | ICD-10-CM | POA: Insufficient documentation

## 2020-05-18 DIAGNOSIS — I10 Essential (primary) hypertension: Secondary | ICD-10-CM | POA: Insufficient documentation

## 2020-05-18 DIAGNOSIS — Z79899 Other long term (current) drug therapy: Secondary | ICD-10-CM | POA: Insufficient documentation

## 2020-05-18 DIAGNOSIS — M25562 Pain in left knee: Secondary | ICD-10-CM | POA: Insufficient documentation

## 2020-05-18 MED ORDER — KETOROLAC TROMETHAMINE 30 MG/ML IJ SOLN
30.0000 mg | Freq: Once | INTRAMUSCULAR | Status: AC
  Administered 2020-05-18: 30 mg via INTRAMUSCULAR
  Filled 2020-05-18: qty 1

## 2020-05-18 MED ORDER — MELOXICAM 15 MG PO TABS: 15.0000 mg | ORAL_TABLET | Freq: Every day | ORAL | 1 refills | Status: AC

## 2020-05-18 NOTE — Discharge Instructions (Signed)
Take Meloxicam daily for pain and inflammation.  

## 2020-05-18 NOTE — ED Triage Notes (Signed)
Patient c/o left knee pain. Denies injury

## 2020-05-18 NOTE — ED Notes (Signed)
Patient declined discharge vital signs. 

## 2020-05-18 NOTE — ED Provider Notes (Signed)
Emergency Department Provider Note  ____________________________________________  Time seen: Approximately 3:53 PM  I have reviewed the triage vital signs and the nursing notes.   HISTORY  Chief Complaint Knee Pain (left)   Historian Patient    HPI Devin Barnes is a 39 y.o. male presents to the emergency department with acute left knee pain.  Patient reports that knee has been bothering him for the past 2 to 3 days.  Pain is worse with extension and relieved with prolonged ambulation.  He denies falls or mechanisms of trauma.  He reports that left knee appears swollen to him.  He denies prior surgeries or injuries to the left knee.  No numbness or tingling of the lower extremities.  No other alleviating measures have been attempted.   Past Medical History:  Diagnosis Date  . Hypertension      Immunizations up to date:  Yes.     Past Medical History:  Diagnosis Date  . Hypertension     There are no problems to display for this patient.   Past Surgical History:  Procedure Laterality Date  . ANAL FISSURE REPAIR      Prior to Admission medications   Medication Sig Start Date End Date Taking? Authorizing Provider  hydrochlorothiazide (HYDRODIURIL) 25 MG tablet Take 1 tablet (25 mg total) by mouth daily. 07/05/19   Tommi Rumps, PA-C  meloxicam (MOBIC) 15 MG tablet Take 1 tablet (15 mg total) by mouth daily for 7 days. 05/18/20 05/25/20  Orvil Feil, PA-C  traMADol (ULTRAM) 50 MG tablet Take 1 tablet (50 mg total) by mouth every 6 (six) hours as needed. 07/05/19   Tommi Rumps, PA-C    Allergies Patient has no known allergies.  History reviewed. No pertinent family history.  Social History Social History   Tobacco Use  . Smoking status: Current Every Day Smoker    Packs/day: 1.00    Types: Cigarettes  . Smokeless tobacco: Never Used  Substance Use Topics  . Alcohol use: No  . Drug use: Never     Review of Systems  Constitutional: No  fever/chills Eyes:  No discharge ENT: No upper respiratory complaints. Respiratory: no cough. No SOB/ use of accessory muscles to breath Gastrointestinal:   No nausea, no vomiting.  No diarrhea.  No constipation. Musculoskeletal: Patient has left knee pain.  Skin: Negative for rash, abrasions, lacerations, ecchymosis.    ____________________________________________   PHYSICAL EXAM:  VITAL SIGNS: ED Triage Vitals  Enc Vitals Group     BP 05/18/20 1357 (!) 180/110     Pulse Rate 05/18/20 1357 99     Resp 05/18/20 1357 18     Temp 05/18/20 1357 98.1 F (36.7 C)     Temp Source 05/18/20 1357 Oral     SpO2 05/18/20 1357 98 %     Weight 05/18/20 1359 280 lb (127 kg)     Height 05/18/20 1359 5\' 10"  (1.778 m)     Head Circumference --      Peak Flow --      Pain Score 05/18/20 1358 10     Pain Loc --      Pain Edu? --      Excl. in GC? --      Constitutional: Alert and oriented. Well appearing and in no acute distress. Eyes: Conjunctivae are normal. PERRL. EOMI. Head: Atraumatic. Cardiovascular: Normal rate, regular rhythm. Normal S1 and S2.  Good peripheral circulation. Respiratory: Normal respiratory effort without tachypnea or retractions. Lungs  CTAB. Good air entry to the bases with no decreased or absent breath sounds Gastrointestinal: Bowel sounds x 4 quadrants. Soft and nontender to palpation. No guarding or rigidity. No distention. Musculoskeletal: Patient performs full range of motion at the left knee.  No deficits noted with provocative testing.  Palpable dorsalis pedis pulse, left. Neurologic:  Normal for age. No gross focal neurologic deficits are appreciated.  Skin:  Skin is warm, dry and intact. No rash noted. Psychiatric: Mood and affect are normal for age. Speech and behavior are normal.   ____________________________________________   LABS (all labs ordered are listed, but only abnormal results are displayed)  Labs Reviewed - No data to  display ____________________________________________  EKG   ____________________________________________  RADIOLOGY Geraldo Pitter, personally viewed and evaluated these images (plain radiographs) as part of my medical decision making, as well as reviewing the written report by the radiologist.  DG Knee Complete 4 Views Left  Result Date: 05/18/2020 CLINICAL DATA:  Acute left knee pain. EXAM: LEFT KNEE - COMPLETE 4+ VIEW COMPARISON:  No prior. FINDINGS: No acute bony abnormality identified. No evidence of fracture dislocation. No evidence of effusion. IMPRESSION: No acute abnormality. Electronically Signed   By: Maisie Fus  Register   On: 05/18/2020 15:22    ____________________________________________    PROCEDURES  Procedure(s) performed:     Procedures     Medications  ketorolac (TORADOL) 30 MG/ML injection 30 mg (has no administration in time range)     ____________________________________________   INITIAL IMPRESSION / ASSESSMENT AND PLAN / ED COURSE  Pertinent labs & imaging results that were available during my care of the patient were reviewed by me and considered in my medical decision making (see chart for details).    Assessment and plan Left knee pain 39 year old male presents to the emergency department with acute left knee pain over the past 2 to 3 days.  On physical exam, patient was able to demonstrate full range of motion at the left knee.  There is no overlying erythema recent history of fever to suggest septic joint.  X-ray of the left knee was reviewed by myself and there were no bony abnormalities.  Patient was discharged with meloxicam and advised to follow-up with orthopedics.  Patient declined crutches.  Return precautions were given to return with new or worsening symptoms.    ____________________________________________  FINAL CLINICAL IMPRESSION(S) / ED DIAGNOSES  Final diagnoses:  Acute pain of left knee      NEW MEDICATIONS  STARTED DURING THIS VISIT:  ED Discharge Orders         Ordered    meloxicam (MOBIC) 15 MG tablet  Daily        05/18/20 1548              This chart was dictated using voice recognition software/Dragon. Despite best efforts to proofread, errors can occur which can change the meaning. Any change was purely unintentional.     Orvil Feil, PA-C 05/18/20 1556    Shaune Pollack, MD 05/23/20 [phone]

## 2020-07-27 ENCOUNTER — Other Ambulatory Visit: Payer: Self-pay

## 2020-07-27 ENCOUNTER — Emergency Department: Payer: Self-pay

## 2020-07-27 ENCOUNTER — Encounter: Payer: Self-pay | Admitting: Emergency Medicine

## 2020-07-27 DIAGNOSIS — A419 Sepsis, unspecified organism: Secondary | ICD-10-CM | POA: Insufficient documentation

## 2020-07-27 DIAGNOSIS — I10 Essential (primary) hypertension: Secondary | ICD-10-CM | POA: Insufficient documentation

## 2020-07-27 DIAGNOSIS — F17210 Nicotine dependence, cigarettes, uncomplicated: Secondary | ICD-10-CM | POA: Insufficient documentation

## 2020-07-27 DIAGNOSIS — L03116 Cellulitis of left lower limb: Secondary | ICD-10-CM | POA: Insufficient documentation

## 2020-07-27 LAB — CBC WITH DIFFERENTIAL/PLATELET
Abs Immature Granulocytes: 0.04 10*3/uL (ref 0.00–0.07)
Basophils Absolute: 0.1 10*3/uL (ref 0.0–0.1)
Basophils Relative: 1 %
Eosinophils Absolute: 0.1 10*3/uL (ref 0.0–0.5)
Eosinophils Relative: 1 %
HCT: 43.3 % (ref 39.0–52.0)
Hemoglobin: 15.1 g/dL (ref 13.0–17.0)
Immature Granulocytes: 0 %
Lymphocytes Relative: 20 %
Lymphs Abs: 2.5 10*3/uL (ref 0.7–4.0)
MCH: 31.1 pg (ref 26.0–34.0)
MCHC: 34.9 g/dL (ref 30.0–36.0)
MCV: 89.1 fL (ref 80.0–100.0)
Monocytes Absolute: 1.2 10*3/uL — ABNORMAL HIGH (ref 0.1–1.0)
Monocytes Relative: 9 %
Neutro Abs: 8.8 10*3/uL — ABNORMAL HIGH (ref 1.7–7.7)
Neutrophils Relative %: 69 %
Platelets: 308 10*3/uL (ref 150–400)
RBC: 4.86 MIL/uL (ref 4.22–5.81)
RDW: 12.6 % (ref 11.5–15.5)
WBC: 12.7 10*3/uL — ABNORMAL HIGH (ref 4.0–10.5)
nRBC: 0 % (ref 0.0–0.2)

## 2020-07-27 LAB — BASIC METABOLIC PANEL
Anion gap: 12 (ref 5–15)
BUN: 11 mg/dL (ref 6–20)
CO2: 26 mmol/L (ref 22–32)
Calcium: 9.1 mg/dL (ref 8.9–10.3)
Chloride: 103 mmol/L (ref 98–111)
Creatinine, Ser: 0.81 mg/dL (ref 0.61–1.24)
GFR, Estimated: >60 mL/min (ref >60)
Glucose, Bld: 175 mg/dL — ABNORMAL HIGH (ref 70–99)
Potassium: 3.9 mmol/L (ref 3.5–5.1)
Sodium: 141 mmol/L (ref 135–145)

## 2020-07-27 LAB — LACTIC ACID, PLASMA
Lactic Acid, Venous: 2.3 mmol/L (ref 0.5–1.9)
Lactic Acid, Venous: 1.1 mmol/L (ref 0.5–1.9)

## 2020-07-27 NOTE — ED Triage Notes (Signed)
Pt arrives w cc of L foot pain and infection. Pt states it's been going on for about 2 months now and pain was 6/7 while on feet at work today. Pt good pedal pulses, warm to touch, able to walk and wiggle toes.

## 2020-07-28 ENCOUNTER — Emergency Department
Admission: EM | Admit: 2020-07-28 | Discharge: 2020-07-28 | Disposition: A | Discharge status: Home / Self Care | Payer: Self-pay | Attending: Emergency Medicine | Admitting: Emergency Medicine

## 2020-07-28 DIAGNOSIS — M79672 Pain in left foot: Secondary | ICD-10-CM

## 2020-07-28 DIAGNOSIS — A419 Sepsis, unspecified organism: Secondary | ICD-10-CM

## 2020-07-28 DIAGNOSIS — L03119 Cellulitis of unspecified part of limb: Secondary | ICD-10-CM

## 2020-07-28 MED ORDER — CLINDAMYCIN HCL 300 MG PO CAPS: 300.0000 mg | ORAL_CAPSULE | Freq: Three times a day (TID) | ORAL | 0 refills | Status: AC

## 2020-07-28 MED ORDER — ONDANSETRON 4 MG PO TBDP: 4.0000 mg | ORAL_TABLET | Freq: Three times a day (TID) | ORAL | 0 refills | Status: DC | PRN

## 2020-07-28 MED ORDER — LACTATED RINGERS IV BOLUS
2000.0000 mL | Freq: Once | INTRAVENOUS | Status: AC
  Administered 2020-07-28: 2000 mL via INTRAVENOUS

## 2020-07-28 MED ORDER — CLINDAMYCIN PHOSPHATE 600 MG/50ML IV SOLN
600.0000 mg | Freq: Once | INTRAVENOUS | Status: AC
  Administered 2020-07-28: 600 mg via INTRAVENOUS
  Filled 2020-07-28 (×2): qty 50

## 2020-07-28 NOTE — Discharge Instructions (Addendum)

## 2020-07-28 NOTE — ED Provider Notes (Signed)
[address] [facility] Emergency Department Provider Note  ____________________________________________  Time seen: Approximately 12:42 AM  I have reviewed the triage vital signs and the nursing notes.   HISTORY  Chief Complaint foot infection   HPI Devin Barnes is a 39 y.o. male the history of hypertension who presents for evaluation of left foot infection.  Patient reports about 2 months ago he stepped on a piece of glass.  He was able to remove the glass.  Has had on and off swelling.  Today the swelling started again and he noticed some redness and warmth of the foot as well.  He denies nausea, vomiting, chills, fever.  He is not a diabetic.  Is complaining of pain which is throbbing and constant after working for several hours on his feet earlier today   Past Medical History:  Diagnosis Date  . Hypertension     Past Surgical History:  Procedure Laterality Date  . ANAL FISSURE REPAIR      Prior to Admission medications   Medication Sig Start Date End Date Taking? Authorizing Provider  clindamycin (CLEOCIN) 300 MG capsule Take 1 capsule (300 mg total) by mouth 3 (three) times daily for 10 days. 07/28/20 08/07/20 Yes Adyn Serna, [place], MD  ondansetron (ZOFRAN ODT) 4 MG disintegrating tablet Take 1 tablet (4 mg total) by mouth every 8 (eight) hours as needed. 07/28/20  Yes Don Perking, [place], MD  hydrochlorothiazide (HYDRODIURIL) 25 MG tablet Take 1 tablet (25 mg total) by mouth daily. 07/05/19   Tommi Rumps, PA-C  traMADol (ULTRAM) 50 MG tablet Take 1 tablet (50 mg total) by mouth every 6 (six) hours as needed. 07/05/19   Tommi Rumps, PA-C    Allergies Patient has no known allergies.  No family history on file.  Social History Social History   Tobacco Use  . Smoking status: Current Every Day Smoker    Packs/day: 1.00    Types: Cigarettes  . Smokeless tobacco: Never Used  Substance Use Topics  . Alcohol use: No  . Drug use: Never     Review of Systems  Constitutional: Negative for fever. Eyes: Negative for visual changes. ENT: Negative for sore throat. Neck: No neck pain  Cardiovascular: Negative for chest pain. Respiratory: Negative for shortness of breath. Gastrointestinal: Negative for abdominal pain, vomiting or diarrhea. Genitourinary: Negative for dysuria. Musculoskeletal: Negative for back pain. + L foot pain Skin: Negative for rash. Neurological: Negative for headaches, weakness or numbness. Psych: No SI or HI  ____________________________________________   PHYSICAL EXAM:  VITAL SIGNS: Vitals:   07/27/20 1946 07/27/20 2307  BP: (!) 166/97 (!) 162/111  Pulse: (!) 105 96  Resp: 18 18  Temp: 99 F (37.2 C) 98.9 F (37.2 C)  SpO2: 99% 98%    Constitutional: Alert and oriented. Well appearing and in no apparent distress. HEENT:      Head: Normocephalic and atraumatic.         Eyes: Conjunctivae are normal. Sclera is non-icteric.       Mouth/Throat: Mucous membranes are moist.       Neck: Supple with no signs of meningismus. Cardiovascular: Regular rate and rhythm. No murmurs, gallops, or rubs. 2+ symmetrical distal pulses are present in all extremities.  Respiratory: Normal respiratory effort. Lungs are clear to auscultation bilaterally.  Gastrointestinal: Soft, non tender. Musculoskeletal: There is swelling of the lateral aspect of the left foot with associated erythema and warmth.  No crepitus, no bulla, no necrosis.  The redness is extending  up the lower leg Neurologic: Normal speech and language. Face is symmetric. Moving all extremities. No gross focal neurologic deficits are appreciated. Skin: Skin is warm, dry and intact. No rash noted. Psychiatric: Mood and affect are normal. Speech and behavior are normal.        ___________ _________________________________   LABS (all labs ordered are listed, but only abnormal results are displayed)  Labs Reviewed  CBC WITH  DIFFERENTIAL/PLATELET - Abnormal; Notable for the following components:      Result Value   WBC 12.7 (*)    Neutro Abs 8.8 (*)    Monocytes Absolute 1.2 (*)    All other components within normal limits  BASIC METABOLIC PANEL - Abnormal; Notable for the following components:   Glucose, Bld 175 (*)    All other components within normal limits  LACTIC ACID, PLASMA - Abnormal; Notable for the following components:   Lactic Acid, Venous 2.3 (*)    All other components within normal limits  CULTURE, BLOOD (SINGLE)  CULTURE, BLOOD (SINGLE)  LACTIC ACID, PLASMA   ____________________________________________  EKG  none  ____________________________________________  RADIOLOGY  I have personally reviewed the images performed during this visit and I agree with the Radiologist's read.   Interpretation by Radiologist:  DG Foot Complete Left  Result Date: 07/27/2020 CLINICAL DATA:  Left foot pain EXAM: LEFT FOOT - COMPLETE 3+ VIEW COMPARISON:  None. FINDINGS: No fracture. Mild lateral subluxation of the base of the fifth distal phalanx with respect to head of proximal phalanx. Focal soft tissue thickening lateral to the base of fifth metatarsal and at the level of the fifth MTP joint. No soft tissue emphysema or radiopaque foreign body. No bone destruction or periostitis IMPRESSION: 1. No fracture. No radiographic evidence for osteomyelitis. Age indeterminate subluxation at the fifth PIP joint 2. Focal soft tissue thickening lateral to the base of the fifth metatarsal and at the level of the fifth MTP joint. No soft tissue emphysema. Electronically Signed   By: Jasmine Pang M.D.   On: 07/27/2020 20:11     ____________________________________________   PROCEDURES  Procedure(s) performed:yes .1-3 Lead EKG Interpretation Performed by: Nita Sickle, MD Authorized by: Nita Sickle, MD     Interpretation: normal     ECG rate assessment: normal     Rhythm: sinus rhythm      Critical Care performed: yes  CRITICAL CARE Performed by: Nita Sickle  ?  Total critical care time: 30 min  Critical care time was exclusive of separately billable procedures and treating other patients.  Critical care was necessary to treat or prevent imminent or life-threatening deterioration.  Critical care was time spent personally by me on the following activities: development of treatment plan with patient and/or surrogate as well as nursing, discussions with consultants, evaluation of patient's response to treatment, examination of patient, obtaining history from patient or surrogate, ordering and performing treatments and interventions, ordering and review of laboratory studies, ordering and review of radiographic studies, pulse oximetry and re-evaluation of patient's condition.  ____________________________________________   INITIAL IMPRESSION / ASSESSMENT AND PLAN / ED COURSE   39 y.o. male the history of hypertension who presents for evaluation of left foot infection.  Patient with cellulitis of the left foot.  Meeting sepsis criteria with tachycardia, leukocytosis, and elevated lactic acidosis.  X-ray visualized by me with no signs of osteomyelitis or free air.  Clinically no signs of necrotizing infection.  Patient was started on sepsis protocol with IV clindamycin, IV fluids, and blood  cultures.  recommended admission for IV antibiotics which patient declined because he has many personal affairs that he needs to take care of.  Patient understands that he meets sepsis criteria and going home on oral antibiotics might not be enough to take care of this infection which could develop into worsening sepsis, septic shock, or death.  We will treat with p.o. clindamycin, referral to wound clinic and very close follow-up. Area of cellulitis demarcated for close monitoring at home.  Patient will leave AGAINST MEDICAL ADVICE.       _____________________________________________ Please note:  Patient was evaluated in Emergency Department today for the symptoms described in the history of present illness. Patient was evaluated in the context of the global COVID-19 pandemic, which necessitated consideration that the patient might be at risk for infection with the SARS-CoV-2 virus that causes COVID-19. Institutional protocols and algorithms that pertain to the evaluation of patients at risk for COVID-19 are in a state of rapid change based on information released by regulatory bodies including the CDC and federal and state organizations. These policies and algorithms were followed during the patient's care in the ED.  Some ED evaluations and interventions may be delayed as a result of limited staffing during the pandemic.   [place] Controlled Substance Database was reviewed by me. ____________________________________________   FINAL CLINICAL IMPRESSION(S) / ED DIAGNOSES   Final diagnoses:  Foot pain, left  Cellulitis of foot  Sepsis without acute organ dysfunction, due to unspecified organism (HCC)      NEW MEDICATIONS STARTED DURING THIS VISIT:  ED Discharge Orders         Ordered    clindamycin (CLEOCIN) 300 MG capsule  3 times daily        07/28/20 0041    ondansetron (ZOFRAN ODT) 4 MG disintegrating tablet  Every 8 hours PRN        07/28/20 0041           Note:  This document was prepared using Dragon voice recognition software and may include unintentional dictation errors.    Nita Sickle, MD 07/28/20 [phone]

## 2020-07-28 NOTE — ED Notes (Signed)
Message sent to pharmacy requesting verification of antibiotics for administration.

## 2020-07-28 NOTE — Progress Notes (Signed)
CODE SEPSIS - PHARMACY COMMUNICATION  **Broad Spectrum Antibiotics should be administered within 1 hour of Sepsis diagnosis**  Time Code Sepsis Called/Page Received: 6283  Antibiotics Ordered: Clindamycin  Time of 1st antibiotic administration: 0113  Otelia Sergeant, PharmD, [facility] 07/28/2020 2:42 AM

## 2020-07-28 NOTE — ED Notes (Signed)
Pharmacy contacted requesting to tube ordered abx for RN to administer.

## 2020-08-01 LAB — CULTURE, BLOOD (SINGLE): Culture: NO GROWTH

## 2020-08-02 LAB — CULTURE, BLOOD (SINGLE)
Culture: NO GROWTH
Special Requests: ADEQUATE

## 2021-04-02 ENCOUNTER — Other Ambulatory Visit: Payer: Self-pay

## 2021-04-02 ENCOUNTER — Emergency Department: Payer: Self-pay

## 2021-04-02 ENCOUNTER — Emergency Department
Admission: EM | Admit: 2021-04-02 | Discharge: 2021-04-02 | Disposition: A | Discharge status: Home / Self Care | Payer: Self-pay | Attending: Student in an Organized Health Care Education/Training Program | Admitting: Student in an Organized Health Care Education/Training Program

## 2021-04-02 DIAGNOSIS — F17210 Nicotine dependence, cigarettes, uncomplicated: Secondary | ICD-10-CM | POA: Insufficient documentation

## 2021-04-02 DIAGNOSIS — I10 Essential (primary) hypertension: Secondary | ICD-10-CM | POA: Insufficient documentation

## 2021-04-02 DIAGNOSIS — L03115 Cellulitis of right lower limb: Secondary | ICD-10-CM | POA: Insufficient documentation

## 2021-04-02 LAB — CBG MONITORING, ED: Glucose-Capillary: 126 mg/dL — ABNORMAL HIGH (ref 70–99)

## 2021-04-02 MED ORDER — KETOROLAC TROMETHAMINE 30 MG/ML IJ SOLN
30.0000 mg | Freq: Once | INTRAMUSCULAR | Status: AC
  Administered 2021-04-02: 30 mg via INTRAMUSCULAR
  Filled 2021-04-02: qty 1

## 2021-04-02 MED ORDER — HYDROCODONE-ACETAMINOPHEN 5-325 MG PO TABS
1.0000 | ORAL_TABLET | Freq: Once | ORAL | Status: AC
  Administered 2021-04-02: 1 via ORAL
  Filled 2021-04-02: qty 1

## 2021-04-02 MED ORDER — CLINDAMYCIN HCL 300 MG PO CAPS: 300.0000 mg | ORAL_CAPSULE | Freq: Three times a day (TID) | ORAL | 0 refills | Status: AC

## 2021-04-02 NOTE — ED Provider Notes (Signed)
[facility] Emergency Department Provider Note    Event Date/Time   First MD Initiated Contact with Patient 04/02/21 1412     (approximate)  I have reviewed the triage vital signs and the nursing notes.   HISTORY  Chief Complaint Wound Infection    HPI Devin Barnes is a 40 y.o. male below listed past medical history presents to the ER for evaluation of right great toe pain for the past several weeks.  States that several weeks ago he had an ingrown toenail that he managed and thinks that it got infected but he treated with topical antibiotic ointments and Epson salt baths.  States that several days ago he dropped a log on that toe and started to have persistent pain now having recurrent redness and discomfort.  No measured fevers.  Denies any history of diabetes  Past Medical History:  Diagnosis Date   Hypertension    No family history on file. Past Surgical History:  Procedure Laterality Date   ANAL FISSURE REPAIR     There are no problems to display for this patient.     Prior to Admission medications   Medication Sig Start Date End Date Taking? Authorizing Provider  clindamycin (CLEOCIN) 300 MG capsule Take 1 capsule (300 mg total) by mouth 3 (three) times daily for 7 days. 04/02/21 04/09/21 Yes Willy Eddy, MD  hydrochlorothiazide (HYDRODIURIL) 25 MG tablet Take 1 tablet (25 mg total) by mouth daily. 07/05/19   Tommi Rumps, PA-C    Allergies Patient has no known allergies.    Social History Social History   Tobacco Use   Smoking status: Every Day    Packs/day: 1.00    Types: Cigarettes   Smokeless tobacco: Never  Substance Use Topics   Alcohol use: No   Drug use: Never    Review of Systems Patient denies headaches, rhinorrhea, blurry vision, numbness, shortness of breath, chest pain, edema, cough, abdominal pain, nausea, vomiting, diarrhea, dysuria, fevers, rashes or hallucinations unless otherwise stated above in  HPI. ____________________________________________   PHYSICAL EXAM:  VITAL SIGNS: Vitals:   04/02/21 1405  BP: (!) 162/116  Pulse: (!) 104  Resp: 20  Temp: 98.8 F (37.1 C)  SpO2: 96%    Constitutional: Alert and oriented.  Eyes: Conjunctivae are normal.  Head: Atraumatic. Nose: No congestion/rhinnorhea. Mouth/Throat: Mucous membranes are moist.   Neck: No stridor. Painless ROM.  Cardiovascular: Normal rate, regular rhythm. Grossly normal heart sounds.  Good peripheral circulation. Respiratory: Normal respiratory effort.  No retractions. Lungs CTAB. Gastrointestinal: Soft and nontender. No distention. No abdominal bruits. No CVA tenderness. Genitourinary:  Musculoskeletal: No lower extremity tenderness nor edema.  No joint effusions. Neurologic:  Normal speech and language. No gross focal neurologic deficits are appreciated. No facial droop Skin:  Skin is warm, dry and intact. No rash noted. Psychiatric: Mood and affect are normal. Speech and behavior are normal.  ____________________________________________   LABS (all labs ordered are listed, but only abnormal results are displayed)  Results for orders placed or performed during the hospital encounter of 04/02/21 (from the past 24 hour(s))  POC CBG, ED     Status: Abnormal   Collection Time: 04/02/21  3:19 PM  Result Value Ref Range   Glucose-Capillary 126 (H) 70 - 99 mg/dL   ____________________________________________ ____________________________________________  RADIOLOGY  I personally reviewed all radiographic images ordered to evaluate for the above acute complaints and reviewed radiology reports and findings.  These findings were personally discussed with  the patient.  Please see medical record for radiology report.  ____________________________________________   PROCEDURES  Procedure(s) performed:  Procedures    Critical Care performed: no ____________________________________________   INITIAL  IMPRESSION / ASSESSMENT AND PLAN / ED COURSE  Pertinent labs & imaging results that were available during my care of the patient were reviewed by me and considered in my medical decision making (see chart for details).   DDX: fracture, contusion, cellulitis, gout, osteo  Devin Barnes is a 40 y.o. who presents to the ED with symptoms as described above.  Patient with pain and wound of the right toe for several weeks by history of sounds like he had a spontaneously draining paronychia.  X-ray ordered to evaluate for fracture osteox-ray reassuring.  Has a small area of mild cellulitis will cover with antibiotics.  He is otherwise well-appearing given the chronicity of this I do not feel that further work-up blood work clinically indicated at this time.  Does appear appropriate for trial of outpatient management.     The patient was evaluated in Emergency Department today for the symptoms described in the history of present illness. He/she was evaluated in the context of the global COVID-19 pandemic, which necessitated consideration that the patient might be at risk for infection with the SARS-CoV-2 virus that causes COVID-19. Institutional protocols and algorithms that pertain to the evaluation of patients at risk for COVID-19 are in a state of rapid change based on information released by regulatory bodies including the CDC and federal and state organizations. These policies and algorithms were followed during the patient's care in the ED.  As part of my medical decision making, I reviewed the following data within the electronic MEDICAL RECORD NUMBER Nursing notes reviewed and incorporated, Labs reviewed, notes from prior ED visits and [place] Controlled Substance Database   ____________________________________________   FINAL CLINICAL IMPRESSION(S) / ED DIAGNOSES  Final diagnoses:  Cellulitis of right foot      NEW MEDICATIONS STARTED DURING THIS VISIT:  New Prescriptions   CLINDAMYCIN (CLEOCIN)  300 MG CAPSULE    Take 1 capsule (300 mg total) by mouth 3 (three) times daily for 7 days.     Note:  This document was prepared using Dragon voice recognition software and may include unintentional dictation errors.    Willy Eddy, MD 04/02/21 [phone]

## 2021-04-02 NOTE — ED Notes (Signed)
Pt reports having an infected right great toe for the past few weeks, pt states he then dropped a log on it and thinks he made the infection worse

## 2021-04-02 NOTE — ED Triage Notes (Signed)
Pt to ED for infection to big toe right foot for a month. Denies fevers. Not diabetic. States dropped log on it a few days ago and now feels worse No redness or drainage noted

## 2021-04-02 NOTE — ED Notes (Signed)
See triage note Having pain and possible infection to right great toe

## 2022-05-04 ENCOUNTER — Other Ambulatory Visit: Payer: Self-pay

## 2022-05-04 ENCOUNTER — Emergency Department: Payer: Self-pay

## 2022-05-04 ENCOUNTER — Emergency Department
Admission: EM | Admit: 2022-05-04 | Discharge: 2022-05-04 | Disposition: A | Discharge status: Home / Self Care | Payer: Self-pay | Attending: Emergency Medicine | Admitting: Emergency Medicine

## 2022-05-04 DIAGNOSIS — K047 Periapical abscess without sinus: Secondary | ICD-10-CM | POA: Insufficient documentation

## 2022-05-04 DIAGNOSIS — R220 Localized swelling, mass and lump, head: Secondary | ICD-10-CM

## 2022-05-04 DIAGNOSIS — K029 Dental caries, unspecified: Secondary | ICD-10-CM | POA: Insufficient documentation

## 2022-05-04 LAB — CBC WITH DIFFERENTIAL/PLATELET
Abs Immature Granulocytes: 0.02 10*3/uL (ref 0.00–0.07)
Basophils Absolute: 0.1 10*3/uL (ref 0.0–0.1)
Basophils Relative: 1 %
Eosinophils Absolute: 0.3 10*3/uL (ref 0.0–0.5)
Eosinophils Relative: 3 %
HCT: 42.9 % (ref 39.0–52.0)
Hemoglobin: 14.5 g/dL (ref 13.0–17.0)
Immature Granulocytes: 0 %
Lymphocytes Relative: 19 %
Lymphs Abs: 1.6 10*3/uL (ref 0.7–4.0)
MCH: 29.8 pg (ref 26.0–34.0)
MCHC: 33.8 g/dL (ref 30.0–36.0)
MCV: 88.1 fL (ref 80.0–100.0)
Monocytes Absolute: 0.8 10*3/uL (ref 0.1–1.0)
Monocytes Relative: 9 %
Neutro Abs: 5.9 10*3/uL (ref 1.7–7.7)
Neutrophils Relative %: 68 %
Platelets: 279 10*3/uL (ref 150–400)
RBC: 4.87 MIL/uL (ref 4.22–5.81)
RDW: 12.5 % (ref 11.5–15.5)
WBC: 8.7 10*3/uL (ref 4.0–10.5)
nRBC: 0 % (ref 0.0–0.2)

## 2022-05-04 LAB — COMPREHENSIVE METABOLIC PANEL
ALT: 14 U/L (ref 0–44)
AST: 22 U/L (ref 15–41)
Albumin: 3.9 g/dL (ref 3.5–5.0)
Alkaline Phosphatase: 80 U/L (ref 38–126)
Anion gap: 7 (ref 5–15)
BUN: 13 mg/dL (ref 6–20)
CO2: 24 mmol/L (ref 22–32)
Calcium: 8.7 mg/dL — ABNORMAL LOW (ref 8.9–10.3)
Chloride: 107 mmol/L (ref 98–111)
Creatinine, Ser: 0.74 mg/dL (ref 0.61–1.24)
GFR, Estimated: >60 mL/min (ref >60)
Glucose, Bld: 175 mg/dL — ABNORMAL HIGH (ref 70–99)
Potassium: 4.1 mmol/L (ref 3.5–5.1)
Sodium: 138 mmol/L (ref 135–145)
Total Bilirubin: 0.5 mg/dL (ref 0.3–1.2)
Total Protein: 7.2 g/dL (ref 6.5–8.1)

## 2022-05-04 LAB — LACTIC ACID, PLASMA
Lactic Acid, Venous: 2.8 mmol/L (ref 0.5–1.9)
Lactic Acid, Venous: 1.2 mmol/L (ref 0.5–1.9)

## 2022-05-04 MED ORDER — OXYCODONE-ACETAMINOPHEN 5-325 MG PO TABS: 1.0000 | ORAL_TABLET | ORAL | 0 refills | Status: DC | PRN

## 2022-05-04 MED ORDER — MORPHINE SULFATE (PF) 4 MG/ML IV SOLN
4.0000 mg | Freq: Once | INTRAVENOUS | Status: AC
  Administered 2022-05-04: 4 mg via INTRAVENOUS
  Filled 2022-05-04: qty 1

## 2022-05-04 MED ORDER — AMOXICILLIN-POT CLAVULANATE 875-125 MG PO TABS
1.0000 | ORAL_TABLET | Freq: Once | ORAL | Status: AC
  Administered 2022-05-04: 1 via ORAL
  Filled 2022-05-04: qty 1

## 2022-05-04 MED ORDER — SODIUM CHLORIDE 0.9 % IV SOLN
3.0000 g | Freq: Once | INTRAVENOUS | Status: AC
  Administered 2022-05-04: 3 g via INTRAVENOUS
  Filled 2022-05-04: qty 8

## 2022-05-04 MED ORDER — LACTATED RINGERS IV BOLUS
1000.0000 mL | Freq: Once | INTRAVENOUS | Status: AC
  Administered 2022-05-04: 1000 mL via INTRAVENOUS

## 2022-05-04 MED ORDER — AMOXICILLIN-POT CLAVULANATE 875-125 MG PO TABS: 1.0000 | ORAL_TABLET | Freq: Two times a day (BID) | ORAL | 0 refills | Status: DC

## 2022-05-04 MED ORDER — IOHEXOL 300 MG/ML  SOLN
75.0000 mL | Freq: Once | INTRAMUSCULAR | Status: AC | PRN
  Administered 2022-05-04: 75 mL via INTRAVENOUS

## 2022-05-04 MED ORDER — OXYCODONE-ACETAMINOPHEN 5-325 MG PO TABS
1.0000 | ORAL_TABLET | Freq: Once | ORAL | Status: AC
  Administered 2022-05-04: 1 via ORAL
  Filled 2022-05-04: qty 1

## 2022-05-04 MED ORDER — ONDANSETRON HCL 4 MG/2ML IJ SOLN
4.0000 mg | Freq: Once | INTRAMUSCULAR | Status: AC
  Administered 2022-05-04: 4 mg via INTRAVENOUS
  Filled 2022-05-04: qty 2

## 2022-05-04 NOTE — Discharge Instructions (Addendum)
Take the Augmentin twice a day.  This is the antibiotic that should take care of the swelling and infection.  Use the Percocet 1 pill 4 times a day as needed for pain.  You can also add some Motrin 2 or 3 of the over-the-counter pills 3 times a day to help.  Please return at once for increasing swelling or for high fever over 101 or vomiting or feeling sicker.  Even if this happens at 3:00 in the morning this morning I would come back for those problems.  Please follow-up with a dentist.  I have given you that list of dental clinics.  Most of these clinics will see you for reduced rates.  [facility] has a Publishing rights manager that I believe will provide you with free your almost free care once you get approved for it.

## 2022-05-04 NOTE — ED Provider Notes (Signed)
[facility] Provider Note    Event Date/Time   First MD Initiated Contact with Patient 05/04/22 1659     (approximate)   History   Dental Pain   HPI  Devin Barnes is a 41 y.o. male who complains of right-sided facial swelling and pain for at least 2 days.  Patient reports is getting worse.  He has very poor dentition.  He says he cannot afford his HCTZ.      Physical Exam   Triage Vital Signs: ED Triage Vitals  Enc Vitals Group     BP 05/04/22 1542 (!) 173/100     Pulse Rate 05/04/22 1542 84     Resp 05/04/22 1542 18     Temp 05/04/22 1542 98.4 F (36.9 C)     Temp Source 05/04/22 1542 Oral     SpO2 05/04/22 1542 95 %     Weight --      Height --      Head Circumference --      Peak Flow --      Pain Score 05/04/22 1541 8     Pain Loc --      Pain Edu? --      Excl. in [place]? --     Most recent vital signs: Vitals:   05/04/22 1542  BP: (!) 173/100  Pulse: 84  Resp: 18  Temp: 98.4 F (36.9 C)  SpO2: 95%     General: Awake, facial swelling on the right side with pain Mouth: Very poor dentition with multiple eroded or nonexistent teeth or just stumps. CV:  Good peripheral perfusion.  Heart regular rate and rhythm no audible murmurs Resp:  Normal effort.  Abd:  No distention.     ED Results / Procedures / Treatments   Labs (all labs ordered are listed, but only abnormal results are displayed) Labs Reviewed  LACTIC ACID, PLASMA - Abnormal; Notable for the following components:      Result Value   Lactic Acid, Venous 2.8 (*)    All other components within normal limits  COMPREHENSIVE METABOLIC PANEL - Abnormal; Notable for the following components:   Glucose, Bld 175 (*)    Calcium 8.7 (*)    All other components within normal limits  LACTIC ACID, PLASMA  CBC WITH DIFFERENTIAL/PLATELET     EKG     RADIOLOGY CT shows multiple small apical root abscesses no other drainable abscess.   PROCEDURES:  Critical Care  performed:   Procedures   MEDICATIONS ORDERED IN ED: Medications  amoxicillin-clavulanate (AUGMENTIN) 875-125 MG per tablet 1 tablet (has no administration in time range)  oxyCODONE-acetaminophen (PERCOCET/ROXICET) 5-325 MG per tablet 1 tablet (has no administration in time range)  lactated ringers bolus 1,000 mL (1,000 mLs Intravenous New Bag/Given 05/04/22 1723)  morphine (PF) 4 MG/ML injection 4 mg (4 mg Intravenous Given 05/04/22 1745)  ondansetron (ZOFRAN) injection 4 mg (4 mg Intravenous Given 05/04/22 1745)  Ampicillin-Sulbactam (UNASYN) 3 g in sodium chloride 0.9 % 100 mL IVPB (0 g Intravenous Stopped 05/04/22 1812)  iohexol (OMNIPAQUE) 300 MG/ML solution 75 mL (75 mLs Intravenous Contrast Given 05/04/22 1729)     IMPRESSION / MDM / ASSESSMENT AND PLAN / ED COURSE  I reviewed the triage vital signs and the nursing notes. I discussed with patient the fact that HCTZ is probably what the cheapest antihypertensive there is and gave him both a good Rx card and a single care Rx card.  He has gotten some  Unasyn IV and I will give him Augmentin and Percocet we will give him the first dose of both here.  He has got a ride home.  Differential diagnosis includes, but is not limited to, facial abscess parotitis multiple tooth abscesses insect bite are all in the differential  Patient's presentation is most consistent with acute presentation with potential threat to life or bodily function.  On discharge patient seems to be better.  His swelling actually has gone down slightly lately.  His lactic acid is better.  He looks well enough to go home and I will let him do so.     FINAL CLINICAL IMPRESSION(S) / ED DIAGNOSES   Final diagnoses:  Infected dental caries  Facial swelling     Rx / DC Orders   ED Discharge Orders          Ordered    oxyCODONE-acetaminophen (PERCOCET) 5-325 MG tablet  Every 4 hours PRN        05/04/22 1917    amoxicillin-clavulanate (AUGMENTIN) 875-125 MG tablet   2 times daily        05/04/22 1917             Note:  This document was prepared using Dragon voice recognition software and may include unintentional dictation errors.   Nena Polio, MD 05/04/22 [phone]

## 2022-05-04 NOTE — ED Triage Notes (Signed)
Pt to ED via POV from home. Pt reports upper right dental pain that is progressively getting worse. Pt right side of cheek swollen. Pt reports hx of tooth infections. Pt states he hasn't been able to take BP meds due to insurance.

## 2022-05-04 NOTE — ED Notes (Signed)
Called pharmacy about compatability of LR and Ampicillian orders. Pharmacy approves.

## 2023-03-13 ENCOUNTER — Emergency Department
Admission: EM | Admit: 2023-03-13 | Discharge: 2023-03-13 | Disposition: A | Discharge status: Home / Self Care | Payer: Self-pay | Attending: Emergency Medicine | Admitting: Emergency Medicine

## 2023-03-13 ENCOUNTER — Emergency Department: Payer: Self-pay

## 2023-03-13 ENCOUNTER — Other Ambulatory Visit: Payer: Self-pay

## 2023-03-13 DIAGNOSIS — E1165 Type 2 diabetes mellitus with hyperglycemia: Secondary | ICD-10-CM | POA: Insufficient documentation

## 2023-03-13 DIAGNOSIS — R319 Hematuria, unspecified: Secondary | ICD-10-CM

## 2023-03-13 LAB — URINALYSIS, ROUTINE W REFLEX MICROSCOPIC
Bacteria, UA: NONE SEEN
Bilirubin Urine: NEGATIVE
Glucose, UA: >=500 mg/dL — AB
Ketones, ur: 5 mg/dL — AB
Leukocytes,Ua: NEGATIVE
Nitrite: NEGATIVE
Protein, ur: NEGATIVE mg/dL
Specific Gravity, Urine: 1.042 — ABNORMAL HIGH (ref 1.005–1.030)
pH: 6 (ref 5.0–8.0)

## 2023-03-13 LAB — COMPREHENSIVE METABOLIC PANEL
ALT: 20 U/L (ref 0–44)
AST: 22 U/L (ref 15–41)
Albumin: 3.5 g/dL (ref 3.5–5.0)
Alkaline Phosphatase: 112 U/L (ref 38–126)
Anion gap: 9 (ref 5–15)
BUN: 13 mg/dL (ref 6–20)
CO2: 26 mmol/L (ref 22–32)
Calcium: 8.6 mg/dL — ABNORMAL LOW (ref 8.9–10.3)
Chloride: 100 mmol/L (ref 98–111)
Creatinine, Ser: 0.66 mg/dL (ref 0.61–1.24)
GFR, Estimated: >60 mL/min (ref >60)
Glucose, Bld: 348 mg/dL — ABNORMAL HIGH (ref 70–99)
Potassium: 3.9 mmol/L (ref 3.5–5.1)
Sodium: 135 mmol/L (ref 135–145)
Total Bilirubin: 0.7 mg/dL (ref 0.3–1.2)
Total Protein: 7.4 g/dL (ref 6.5–8.1)

## 2023-03-13 LAB — CBC WITH DIFFERENTIAL/PLATELET
Abs Immature Granulocytes: 0.02 10*3/uL (ref 0.00–0.07)
Basophils Absolute: 0.1 10*3/uL (ref 0.0–0.1)
Basophils Relative: 1 %
Eosinophils Absolute: 0.2 10*3/uL (ref 0.0–0.5)
Eosinophils Relative: 2 %
HCT: 41.6 % (ref 39.0–52.0)
Hemoglobin: 14.7 g/dL (ref 13.0–17.0)
Immature Granulocytes: 0 %
Lymphocytes Relative: 27 %
Lymphs Abs: 2.5 10*3/uL (ref 0.7–4.0)
MCH: 31 pg (ref 26.0–34.0)
MCHC: 35.3 g/dL (ref 30.0–36.0)
MCV: 87.8 fL (ref 80.0–100.0)
Monocytes Absolute: 0.9 10*3/uL (ref 0.1–1.0)
Monocytes Relative: 10 %
Neutro Abs: 5.5 10*3/uL (ref 1.7–7.7)
Neutrophils Relative %: 60 %
Platelets: 278 10*3/uL (ref 150–400)
RBC: 4.74 MIL/uL (ref 4.22–5.81)
RDW: 12.3 % (ref 11.5–15.5)
WBC: 9.1 10*3/uL (ref 4.0–10.5)
nRBC: 0 % (ref 0.0–0.2)

## 2023-03-13 LAB — CBG MONITORING, ED: Glucose-Capillary: 186 mg/dL — ABNORMAL HIGH (ref 70–99)

## 2023-03-13 MED ORDER — LANCETS MISC. MISC: 1.0000 | Freq: Three times a day (TID) | 6 refills | Status: AC

## 2023-03-13 MED ORDER — SODIUM CHLORIDE 0.9 % IV BOLUS
1000.0000 mL | Freq: Once | INTRAVENOUS | Status: AC
  Administered 2023-03-13: 1000 mL via INTRAVENOUS

## 2023-03-13 MED ORDER — METFORMIN HCL 500 MG PO TABS: 500.0000 mg | ORAL_TABLET | Freq: Two times a day (BID) | ORAL | 6 refills | Status: AC

## 2023-03-13 MED ORDER — BLOOD GLUCOSE TEST VI STRP
1.0000 | ORAL_STRIP | Freq: Three times a day (TID) | 6 refills | Status: AC
Start: 2023-03-13 — End: 2024-03-12

## 2023-03-13 MED ORDER — BLOOD GLUCOSE MONITORING SUPPL DEVI: 1.0000 | Freq: Three times a day (TID) | 0 refills | Status: AC

## 2023-03-13 MED ORDER — INSULIN ASPART 100 UNIT/ML IJ SOLN
10.0000 [IU] | Freq: Once | INTRAMUSCULAR | Status: AC
  Administered 2023-03-13: 10 [IU] via INTRAVENOUS
  Filled 2023-03-13: qty 1

## 2023-03-13 MED ORDER — LANCET DEVICE MISC: 1.0000 | Freq: Three times a day (TID) | 0 refills | Status: AC

## 2023-03-13 MED ORDER — AMOXICILLIN 875 MG PO TABS: 875.0000 mg | ORAL_TABLET | Freq: Two times a day (BID) | ORAL | 0 refills | Status: AC

## 2023-03-13 NOTE — ED Notes (Signed)
See triage note  Presents with a 2 month hx of lower back pain  Then he noticed some blood in his urine couple of days ago  No fever or n/v

## 2023-03-13 NOTE — ED Triage Notes (Signed)
Pt reports blood in urine that started today. Pt reports he has noticed a change in bladder function for 2 months. Pt also reports burning with urination. Pt denies new back pain (pt has chronic back pain) or abd pain.

## 2023-03-13 NOTE — ED Provider Notes (Signed)
[facility] Provider Note  Patient Contact: 3:18 PM (approximate)   History   Hematuria   HPI  Devin Barnes is a 42 y.o. male who presents the emergency department complaining of increased urination, burning at the very end of urination, and hematuria x 2 days.  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  States that at symptoms have been very mild over the past 2 months but when he started to develop some hematuria he was concerned and presents to the ED.  He has no back or flank pain, testicular pain, significant burning with urination.  He states that he will be urinating normally, then have some burning at the very end of urination.  He states that he is not sexually active and no concern for STD.     Physical Exam   Triage Vital Signs: ED Triage Vitals  Encounter Vitals Group     BP 03/13/23 1415 135/88     Systolic BP Percentile --      Diastolic BP Percentile --      Pulse Rate 03/13/23 1415 90     Resp 03/13/23 1415 (!) 22     Temp 03/13/23 1415 98 F (36.7 C)     Temp src --      SpO2 03/13/23 1415 94 %     Weight --      Height --      Head Circumference --      Peak Flow --      Pain Score 03/13/23 1414 0     Pain Loc --      Pain Education --      Exclude from Growth Chart --     Most recent vital signs: Vitals:   03/13/23 1415 03/13/23 1753  BP: 135/88 130/80  Pulse: 90 87  Resp: (!) 22 20  Temp: 98 F (36.7 C)   SpO2: 94% 97%     General: Alert and in no acute distress. ENT:      Ears:       Nose: No congestion/rhinnorhea.      Mouth/Throat: Mucous membranes are moist. Neck: No stridor. No cervical spine tenderness to palpation.  Cardiovascular:  Good peripheral perfusion Respiratory: Normal respiratory effort without tachypnea or retractions. Lungs CTAB. Good air entry to the bases with no decreased or absent breath  sounds. Gastrointestinal: Bowel sounds 4 quadrants. Soft and nontender to palpation. No guarding or rigidity. No palpable masses. No distention. No CVA tenderness. Musculoskeletal: Full range of motion to all extremities.  Neurologic:  No gross focal neurologic deficits are appreciated.  Skin:   No rash noted Other:   ED Results / Procedures / Treatments   Labs (all labs ordered are listed, but only abnormal results are displayed) Labs Reviewed  URINALYSIS, ROUTINE W REFLEX MICROSCOPIC - Abnormal; Notable for the following components:      Result Value   Color, Urine YELLOW (*)    APPearance CLEAR (*)    Specific Gravity, Urine 1.042 (*)    Glucose, UA >=500 (*)    Hgb urine dipstick SMALL (*)    Ketones, ur 5 (*)    All other components within normal limits  COMPREHENSIVE METABOLIC PANEL - Abnormal; Notable for the following components:   Glucose, Bld 348 (*)    Calcium 8.6 (*)    All other components within normal limits  CBC  WITH DIFFERENTIAL/PLATELET     EKG     RADIOLOGY  I personally viewed, evaluated, and interpreted these images as part of my medical decision making, as well as reviewing the written report by the radiologist.  ED Provider Interpretation: No acute findings on CT scan to explain patient's symptoms  CT Renal Stone Study  Result Date: 03/13/2023 CLINICAL DATA:  Flank pain, blood in urine burning with urination EXAM: CT ABDOMEN AND PELVIS WITHOUT CONTRAST TECHNIQUE: Multidetector CT imaging of the abdomen and pelvis was performed following the standard protocol without IV contrast. RADIATION DOSE REDUCTION: This exam was performed according to the departmental dose-optimization program which includes automated exposure control, adjustment of the mA and/or kV according to patient size and/or use of iterative reconstruction technique. COMPARISON:  CT 03/14/2020 FINDINGS: Lower chest: Lung bases are clear. Hepatobiliary: Hepatic steatosis. Suspect more  focal fat deposition near the porta hepatis. No calcified gallstone or biliary dilatation Pancreas: Unremarkable. No pancreatic ductal dilatation or surrounding inflammatory changes. Spleen: Normal in size without focal abnormality. Adrenals/Urinary Tract: Adrenal glands are unremarkable. Kidneys are normal, without renal calculi, focal lesion, or hydronephrosis. Bladder is unremarkable. Stomach/Bowel: Stomach is within normal limits. Appendix appears normal. No evidence of bowel wall thickening, distention, or inflammatory changes. Vascular/Lymphatic: Nonaneurysmal aorta with mild atherosclerosis. Mildly enlarged bilateral external iliac nodes, measuring 15 mm on the right and 17 mm on the left but not significantly changed compared to prior. Enlarged inguinal nodes measuring up to 13 mm on the right and 16 mm on the left. Reproductive: Prostate is unremarkable. Other: Negative for pelvic effusion or free air Musculoskeletal: No acute or suspicious osseous abnormality. IMPRESSION: 1. Negative for hydronephrosis or nephrolithiasis. 2. Hepatic steatosis. 3. Mildly enlarged bilateral external iliac and inguinal nodes, nonspecific and not significantly changed compared to prior CT. 4. Aortic atherosclerosis. Aortic Atherosclerosis (ICD10-I70.0). Electronically Signed   By: Jasmine Pang M.D.   On: 03/13/2023 17:32    PROCEDURES:  Critical Care performed: No  Procedures   MEDICATIONS ORDERED IN ED: Medications  sodium chloride 0.9 % bolus 1,000 mL (has no administration in time range)  insulin aspart (novoLOG) injection 10 Units (has no administration in time range)     IMPRESSION / MDM / ASSESSMENT AND PLAN / ED COURSE  I reviewed the triage vital signs and the nursing notes.                                 Differential diagnosis includes, but is not limited to, UTI, STD, nephrolithiasis, dehydration, diabetes, electrolyte abnormality   Patient's presentation is most consistent with acute  presentation with potential threat to life or bodily function.   Patient's diagnosis is consistent with type 2 diabetes,, hematuria.  Patient presents to the emergency department with complaint of 2 months of increased urination.  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  Patient arrived with some slight burning in the very end of urination and hematuria for 2 days.  No back pain, flank pain, fevers, chills.  Patient states that he has a familial history of diabetes but was not a diabetic.  Given patient symptoms I was concerned that he could be diabetic, dehydrated from the temperature, STD, nephrolithiasis.  As such labs, imaging was obtained.  Patient's random glucose is 348.  At this time patient states that he has been diagnosed with prediabetes  in the past.  Feel the patient likely is truly diabetic at this time.  Workup is otherwise reassuring.  He had a small amount of hemoglobin in his urine but no evidence of infection.  He is not sexually active to be concern for STD.  Suspect that he has cystitis secondary to the amount of fluids he is drinking as well as the likely intermittent dehydration he experiences.  This time we will treat with fluids and insulin for the diabetic issue.  Will place the patient on metformin and have him follow-up primary care.  If he has changes to the urination may or always return or follow-up with urology but I suspect that this will improve once we have his diabetes better managed..  Patient is given ED precautions to return to the ED for any worsening or new symptoms.     FINAL CLINICAL IMPRESSION(S) / ED DIAGNOSES   Final diagnoses:  Type 2 diabetes mellitus with hyperglycemia, without long-term current use of insulin (HCC)  Hematuria, unspecified type     Rx / DC Orders   ED Discharge Orders     None        Note:  This document was prepared using Dragon voice recognition software and  may include unintentional dictation errors.   Lanette Hampshire 03/13/23 2124    Chesley Noon, MD 03/14/23 [phone]

## 2023-09-02 IMAGING — DX DG TOE GREAT 2+V*R*
3 series · 3 of 3 positions shown · non-contrast
Comparison: None.

CLINICAL DATA: Patient states recent ingrown toenail injection,
then dropped a object on it.

EXAM:
RIGHT GREAT TOE

[toe ap]
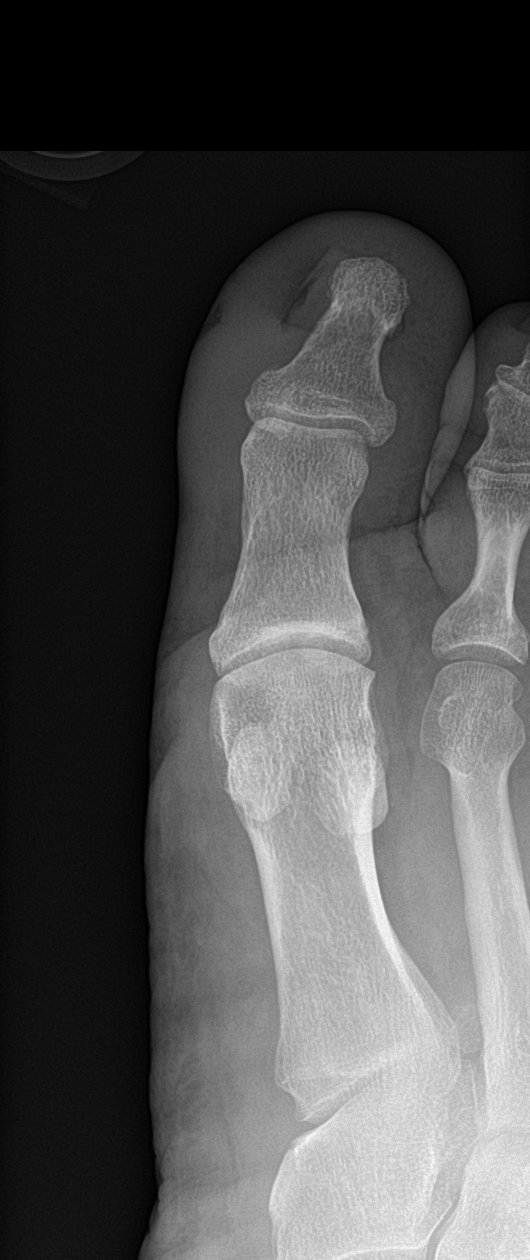

[toe obl]
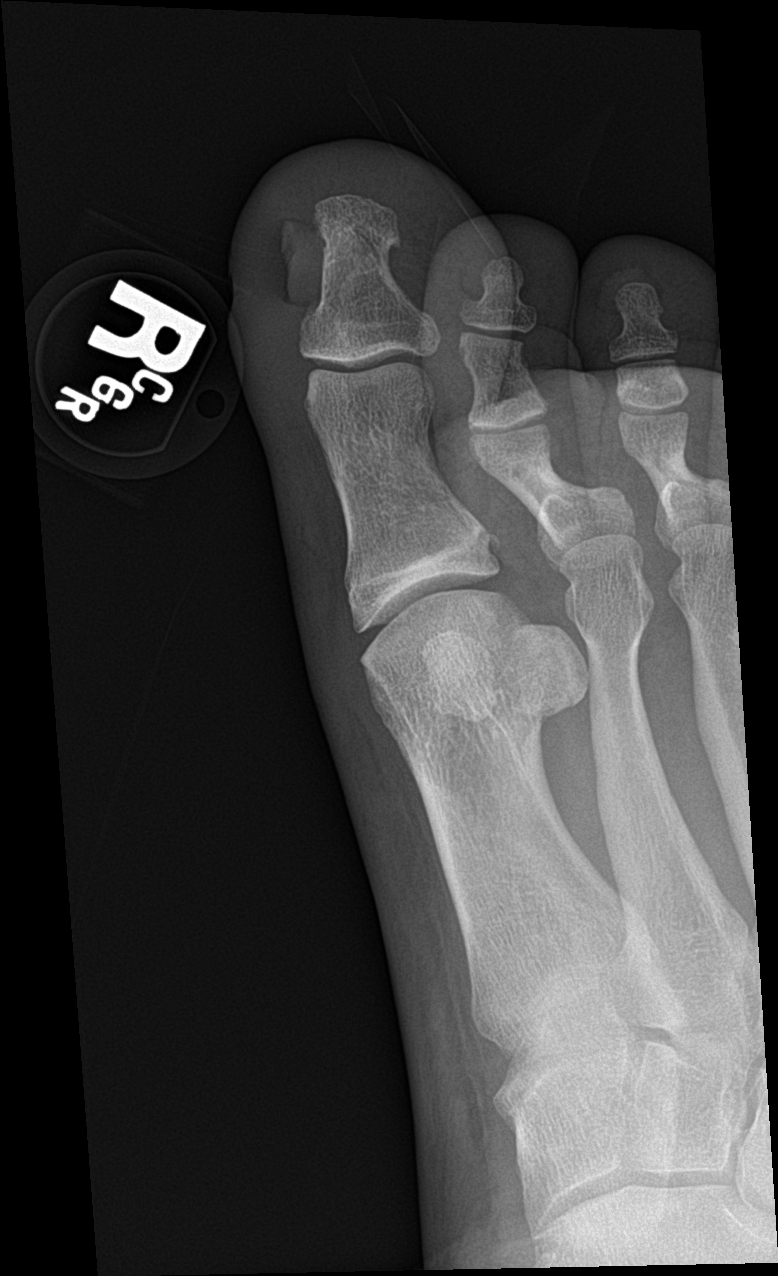

[toe lat]
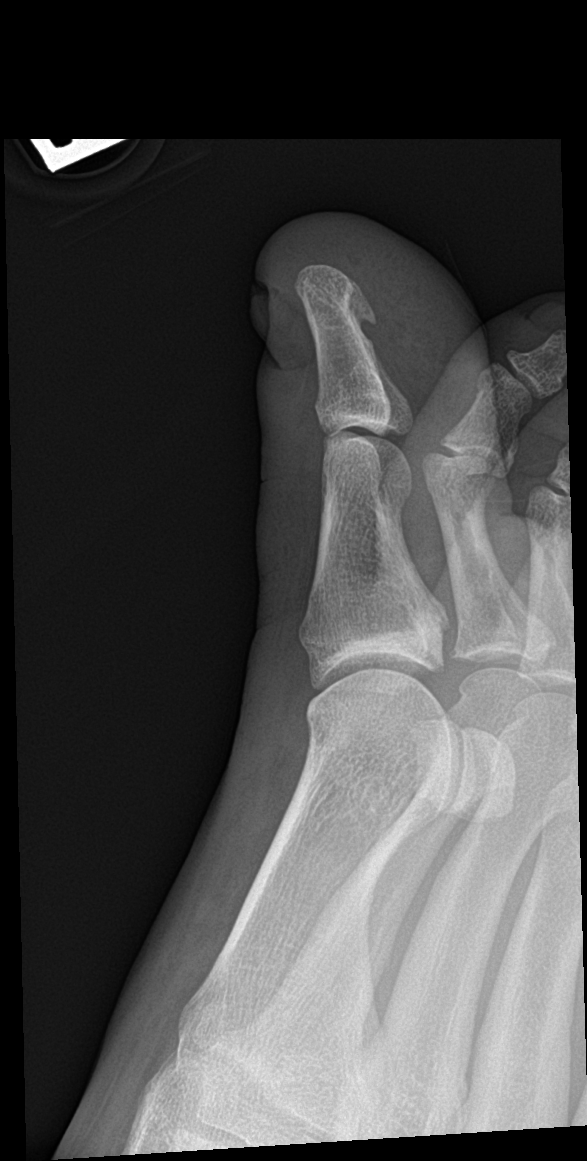

[3 of 3 positions shown; findings below may reference images not displayed]

FINDINGS: There is no evidence of fracture or dislocation. There is no
evidence of arthropathy or other focal bone abnormality. Soft
tissues are unremarkable.
IMPRESSION: Negative.

## 2024-03-17 ENCOUNTER — Emergency Department

## 2024-03-17 ENCOUNTER — Observation Stay
Admission: EM | Admit: 2024-03-17 | Discharge: 2024-03-17 | Disposition: A | Discharge status: Home / Self Care | Attending: Student | Admitting: Student

## 2024-03-17 ENCOUNTER — Other Ambulatory Visit: Payer: Self-pay

## 2024-03-17 DIAGNOSIS — E119 Type 2 diabetes mellitus without complications: Secondary | ICD-10-CM | POA: Insufficient documentation

## 2024-03-17 DIAGNOSIS — K047 Periapical abscess without sinus: Principal | ICD-10-CM | POA: Insufficient documentation

## 2024-03-17 DIAGNOSIS — Z6841 Body Mass Index (BMI) 40.0 and over, adult: Secondary | ICD-10-CM | POA: Diagnosis not present

## 2024-03-17 DIAGNOSIS — I10 Essential (primary) hypertension: Secondary | ICD-10-CM | POA: Diagnosis not present

## 2024-03-17 DIAGNOSIS — L03211 Cellulitis of face: Principal | ICD-10-CM | POA: Insufficient documentation

## 2024-03-17 DIAGNOSIS — E66813 Obesity, class 3: Secondary | ICD-10-CM | POA: Diagnosis not present

## 2024-03-17 DIAGNOSIS — R519 Headache, unspecified: Secondary | ICD-10-CM | POA: Diagnosis present

## 2024-03-17 DIAGNOSIS — F17210 Nicotine dependence, cigarettes, uncomplicated: Secondary | ICD-10-CM | POA: Insufficient documentation

## 2024-03-17 LAB — GLUCOSE, CAPILLARY
Glucose-Capillary: 195 mg/dL — ABNORMAL HIGH (ref 70–99)
Glucose-Capillary: 150 mg/dL — ABNORMAL HIGH (ref 70–99)

## 2024-03-17 LAB — BASIC METABOLIC PANEL WITH GFR
Anion gap: 11 (ref 5–15)
BUN: 16 mg/dL (ref 6–20)
CO2: 24 mmol/L (ref 22–32)
Calcium: 8.7 mg/dL — ABNORMAL LOW (ref 8.9–10.3)
Chloride: 101 mmol/L (ref 98–111)
Creatinine, Ser: 0.67 mg/dL (ref 0.61–1.24)
GFR, Estimated: >60 mL/min (ref >60)
Glucose, Bld: 125 mg/dL — ABNORMAL HIGH (ref 70–99)
Potassium: 4.1 mmol/L (ref 3.5–5.1)
Sodium: 136 mmol/L (ref 135–145)

## 2024-03-17 LAB — LACTIC ACID, PLASMA
Lactic Acid, Venous: 1.1 mmol/L (ref 0.5–1.9)
Lactic Acid, Venous: 1.1 mmol/L (ref 0.5–1.9)

## 2024-03-17 LAB — CBC WITH DIFFERENTIAL/PLATELET
Abs Immature Granulocytes: 0.04 K/uL (ref 0.00–0.07)
Basophils Absolute: 0.1 K/uL (ref 0.0–0.1)
Basophils Relative: 1 %
Eosinophils Absolute: 0.1 K/uL (ref 0.0–0.5)
Eosinophils Relative: 1 %
HCT: 40.7 % (ref 39.0–52.0)
Hemoglobin: 14.6 g/dL (ref 13.0–17.0)
Immature Granulocytes: 0 %
Lymphocytes Relative: 16 %
Lymphs Abs: 1.7 K/uL (ref 0.7–4.0)
MCH: 31.6 pg (ref 26.0–34.0)
MCHC: 35.9 g/dL (ref 30.0–36.0)
MCV: 88.1 fL (ref 80.0–100.0)
Monocytes Absolute: 1.1 K/uL — ABNORMAL HIGH (ref 0.1–1.0)
Monocytes Relative: 10 %
Neutro Abs: 7.7 K/uL (ref 1.7–7.7)
Neutrophils Relative %: 72 %
Platelets: 258 K/uL (ref 150–400)
RBC: 4.62 MIL/uL (ref 4.22–5.81)
RDW: 12.6 % (ref 11.5–15.5)
WBC: 10.8 K/uL — ABNORMAL HIGH (ref 4.0–10.5)
nRBC: 0 % (ref 0.0–0.2)

## 2024-03-17 LAB — HEMOGLOBIN A1C
Hgb A1c MFr Bld: 6 % — ABNORMAL HIGH (ref 4.8–5.6)
Mean Plasma Glucose: 125.5 mg/dL

## 2024-03-17 MED ORDER — INSULIN ASPART 100 UNIT/ML IJ SOLN
0.0000 [IU] | Freq: Three times a day (TID) | INTRAMUSCULAR | Status: DC
  Administered 2024-03-17: 3 [IU] via SUBCUTANEOUS
  Administered 2024-03-17: 4 [IU] via SUBCUTANEOUS
  Filled 2024-03-17 (×2): qty 1

## 2024-03-17 MED ORDER — MORPHINE SULFATE (PF) 4 MG/ML IV SOLN
4.0000 mg | Freq: Once | INTRAVENOUS | Status: AC
  Administered 2024-03-17: 4 mg via INTRAVENOUS
  Filled 2024-03-17: qty 1

## 2024-03-17 MED ORDER — SODIUM CHLORIDE 0.9 % IV SOLN
3.0000 g | Freq: Once | INTRAVENOUS | Status: AC
  Administered 2024-03-17: 3 g via INTRAVENOUS
  Filled 2024-03-17: qty 8

## 2024-03-17 MED ORDER — CEFAZOLIN SODIUM-DEXTROSE 2-4 GM/100ML-% IV SOLN: 2.0000 g | Freq: Three times a day (TID) | INTRAVENOUS | Status: DC

## 2024-03-17 MED ORDER — ONDANSETRON HCL 4 MG/2ML IJ SOLN
4.0000 mg | Freq: Once | INTRAMUSCULAR | Status: AC
  Administered 2024-03-17: 4 mg via INTRAVENOUS
  Filled 2024-03-17: qty 2

## 2024-03-17 MED ORDER — ACETAMINOPHEN 325 MG PO TABS: 650.0000 mg | ORAL_TABLET | Freq: Four times a day (QID) | ORAL | Status: DC | PRN

## 2024-03-17 MED ORDER — HYDROCHLOROTHIAZIDE 25 MG PO TABS
25.0000 mg | ORAL_TABLET | Freq: Every day | ORAL | Status: DC
  Administered 2024-03-17: 25 mg via ORAL
  Filled 2024-03-17: qty 1

## 2024-03-17 MED ORDER — ONDANSETRON HCL 4 MG PO TABS: 4.0000 mg | ORAL_TABLET | Freq: Four times a day (QID) | ORAL | Status: DC | PRN

## 2024-03-17 MED ORDER — MAGNESIUM HYDROXIDE 400 MG/5ML PO SUSP
30.0000 mL | Freq: Every day | ORAL | Status: DC | PRN
Start: 2024-03-17 — End: 2024-03-17

## 2024-03-17 MED ORDER — SODIUM CHLORIDE 0.9 % IV SOLN
3.0000 g | Freq: Four times a day (QID) | INTRAVENOUS | Status: DC
  Administered 2024-03-17 (×2): 3 g via INTRAVENOUS
  Filled 2024-03-17 (×4): qty 8

## 2024-03-17 MED ORDER — ONDANSETRON HCL 4 MG/2ML IJ SOLN: 4.0000 mg | Freq: Four times a day (QID) | INTRAMUSCULAR | Status: DC | PRN

## 2024-03-17 MED ORDER — INSULIN ASPART 100 UNIT/ML IJ SOLN: 0.0000 [IU] | Freq: Every day | INTRAMUSCULAR | Status: DC

## 2024-03-17 MED ORDER — ACETAMINOPHEN 650 MG RE SUPP: 650.0000 mg | Freq: Four times a day (QID) | RECTAL | Status: DC | PRN

## 2024-03-17 MED ORDER — IOHEXOL 300 MG/ML  SOLN
75.0000 mL | Freq: Once | INTRAMUSCULAR | Status: AC | PRN
  Administered 2024-03-17: 75 mL via INTRAVENOUS

## 2024-03-17 MED ORDER — TRAZODONE HCL 50 MG PO TABS: 25.0000 mg | ORAL_TABLET | Freq: Every evening | ORAL | Status: DC | PRN

## 2024-03-17 MED ORDER — MORPHINE SULFATE (PF) 2 MG/ML IV SOLN: 2.0000 mg | INTRAVENOUS | Status: DC | PRN

## 2024-03-17 MED ORDER — SODIUM CHLORIDE 0.9 % IV BOLUS (SEPSIS)
1000.0000 mL | Freq: Once | INTRAVENOUS | Status: AC
  Administered 2024-03-17: 1000 mL via INTRAVENOUS

## 2024-03-17 MED ORDER — DEXAMETHASONE SODIUM PHOSPHATE 10 MG/ML IJ SOLN
10.0000 mg | Freq: Once | INTRAMUSCULAR | Status: AC
  Administered 2024-03-17: 10 mg via INTRAVENOUS
  Filled 2024-03-17: qty 1

## 2024-03-17 MED ORDER — ENOXAPARIN SODIUM 80 MG/0.8ML IJ SOSY
65.0000 mg | PREFILLED_SYRINGE | INTRAMUSCULAR | Status: DC
  Administered 2024-03-17: 65 mg via SUBCUTANEOUS
  Filled 2024-03-17: qty 0.65

## 2024-03-17 MED ORDER — IPRATROPIUM-ALBUTEROL 0.5-2.5 (3) MG/3ML IN SOLN
3.0000 mL | RESPIRATORY_TRACT | Status: AC
  Administered 2024-03-17 (×3): 3 mL via RESPIRATORY_TRACT
  Filled 2024-03-17: qty 3
  Filled 2024-03-17: qty 6

## 2024-03-17 NOTE — Discharge Instructions (Addendum)
 Please follow up with your dentist in the AM.   Please continue the antibiotics that were prescribed to you by your dentist starting 8/21.    CT Maxillofacial W Contrast  FINDINGS: Approximately 1.2 x 0.3 x 1.1 cm peripherally enhancing fluid collection along the anterior right maxilla adjacent to the right lateral incisor which has a periapical lucency. Overlying soft tissue edema and thickening.   Multiple additional disease teeth with periapical lucencies and dental caries.   No evidence of acute fracture.  TMJs are located.   Left maxillary sinus retention cyst.  Otherwise, clear sinuses.   Normal appearance of the orbits.   Intracranially, no obvious abnormality in the visualized brain.   No mastoid effusions.   IMPRESSION: Approximately 1.2 cm subperiosteal abscess adjacent to a diseased right lateral maxillary incisor with overlying cellulitis

## 2024-03-17 NOTE — Progress Notes (Signed)
 Patient to discharge home. PIV removed. Follow up instructions given to patient. Education provided regarding antibiotics. Questions answered. No concerns voiced

## 2024-03-17 NOTE — ED Triage Notes (Signed)
 Pt reports he was seen at dentist today for infected tooth, pt reports he was placed on Augmentin  today also. Pt states tonight after the dentist he developed swelling to the right side of his face. Pt repots he is supposed to go back Thursday for an extraction

## 2024-03-17 NOTE — Assessment & Plan Note (Signed)
-   He will be placed on supplemental coverage with NovoLog . - Will hold off metformin .

## 2024-03-17 NOTE — Hospital Course (Signed)
 Swelling decreased, no increased warmth or erythema of overlying skin.

## 2024-03-17 NOTE — Progress Notes (Signed)
 PHARMACIST - PHYSICIAN COMMUNICATION  CONCERNING:  Enoxaparin  (Lovenox ) for DVT Prophylaxis    RECOMMENDATION: Patient was prescribed enoxaprin 40mg  q24 hours for VTE prophylaxis.   Filed Weights   03/17/24 0035  Weight: 127.9 kg (282 lb)    Body mass index is 40.46 kg/m.  Estimated Creatinine Clearance: 163.3 mL/min (by C-G formula based on SCr of 0.67 mg/dL).   Based on [facility] policy patient is candidate for enoxaparin  0.5mg /kg TBW SQ every 24 hours based on BMI being >30.  DESCRIPTION: Pharmacy has adjusted enoxaparin  dose per [facility] policy.  Patient is now receiving enoxaparin  0.5 mg/kg every 24 hours   Rankin CANDIE Dills, PharmD, [facility] 03/17/2024 5:22 AM

## 2024-03-17 NOTE — H&P (Signed)
 [place]   PATIENT NAME: Devin Barnes    MR#:  [id number]  DATE OF BIRTH:  July 06, 1981  DATE OF ADMISSION:  03/17/2024  PRIMARY CARE PHYSICIAN: Pcp, No   Patient is coming from: Home  REQUESTING/REFERRING PHYSICIAN: Ward, Devin SAILOR, DO  CHIEF COMPLAINT:   Chief Complaint  Patient presents with   Dental Pain    HISTORY OF PRESENT ILLNESS:  Devin Barnes is a 43 y.o. Caucasian male with medical history significant for essential hypertension, tobacco abuse and type II Diabetes mellitus, presented to the emergency room with acute onset of right facial swelling with warmth and tenderness over the last 3 days.  He has been having right sided toothache for a while.  No fever or chills.  No nausea or vomiting or abdominal pain.  No chest pain or palpitations.  No cough or wheezing or dyspnea.  The patient has a dental appointment tomorrow.  ED Course: When he came to the ER, BP was 188/111 with otherwise normal vital signs.  Labs revealed unremarkable BMP.  CBC showed WBCs of 10.8 and lactic acid was 1.1 twice.  Blood cultures were drawn. EKG as reviewed by me : None Imaging: Portable chest x-ray showed bronchitic/reactive airways.  CT maxillofacial with contrast revealed approximately 1.2 cm subperiosteal abscess adjacent to a diseased right lateral maxillary incisor with overlying cellulitis.  The patient was given IV Unasyn , 10 mg IV Decadron , 4 mg of IV morphine  sulfate twice, 4 mg of IV Zofran  and 1 L bolus of IV normal saline.  He will be admitted to a medical-surgical observation bed for further evaluation and management. PAST MEDICAL HISTORY:   Past Medical History:  Diagnosis Date   Hypertension   -Tobacco abuse - Type 2 diabetes mellitus.  PAST SURGICAL HISTORY:   Past Surgical History:  Procedure Laterality Date   ANAL FISSURE REPAIR      SOCIAL HISTORY:   Social History   Tobacco Use   Smoking status: Every Day    Current packs/day: 1.00    Types:  Cigarettes   Smokeless tobacco: Never  Substance Use Topics   Alcohol use: No    FAMILY HISTORY:   Positive for diabetes mellitus, hypertension and CVA.  DRUG ALLERGIES:  No Known Allergies  REVIEW OF SYSTEMS:   ROS As per history of present illness. All pertinent systems were reviewed above. Constitutional, HEENT, cardiovascular, respiratory, GI, GU, musculoskeletal, neuro, psychiatric, endocrine, integumentary and hematologic systems were reviewed and are otherwise negative/unremarkable except for positive findings mentioned above in the HPI.   MEDICATIONS AT HOME:   Prior to Admission medications   Medication Sig Start Date End Date Taking? Authorizing Provider  amoxicillin  (AMOXIL ) 875 MG tablet Take 1 tablet (875 mg total) by mouth 2 (two) times daily. 03/13/23   Devin Barnes, Devin BIRCH, PA-C  Blood Glucose Monitoring Suppl DEVI 1 each by Does not apply route in the morning, at noon, and at bedtime. May substitute to any manufacturer covered by patient's insurance. 03/13/23   Devin Barnes, Devin BIRCH, PA-C  hydrochlorothiazide  (HYDRODIURIL ) 25 MG tablet Take 1 tablet (25 mg total) by mouth daily. 07/05/19   Saunders Devin CROME, PA-C  metFORMIN  (GLUCOPHAGE ) 500 MG tablet Take 1 tablet (500 mg total) by mouth 2 (two) times daily with a meal. 03/13/23   Devin Barnes, Devin BIRCH, PA-C      VITAL SIGNS:  Blood pressure (!) 142/84, pulse 79, temperature 98.2 F (36.8 C), resp. rate 18, height 5' 10 (1.778  m), weight 127.9 kg, SpO2 96%.  PHYSICAL EXAMINATION:  Physical Exam  GENERAL:  43 y.o.-year-old Caucasian male patient lying in the bed with no acute distress.  EYES: Pupils equal, round, reactive to light and accommodation. No scleral icterus. Extraocular muscles intact.  HEENT: Head atraumatic, normocephalic.  Right facial swelling with warmth, erythema and tenderness with right sided incisor tenderness and abscess.SABRA  NECK:  Supple, no jugular venous distention. No thyroid enlargement,  no tenderness.  LUNGS: Normal breath sounds bilaterally, no wheezing, rales,rhonchi or crepitation. No use of accessory muscles of respiration.  CARDIOVASCULAR: Regular rate and rhythm, S1, S2 normal. No murmurs, rubs, or gallops.  ABDOMEN: Soft, nondistended, nontender. Bowel sounds present. No organomegaly or mass.  EXTREMITIES: No pedal edema, cyanosis, or clubbing.  NEUROLOGIC: Cranial nerves II through XII are intact. Muscle strength 5/5 in all extremities. Sensation intact. Gait not checked.  PSYCHIATRIC: The patient is alert and oriented x 3.  Normal affect and good eye contact. SKIN: As above otherwise no obvious other rash, lesion, or ulcer.   LABORATORY PANEL:   CBC Recent Labs  Lab 03/17/24 0237  WBC 10.8*  HGB 14.6  HCT 40.7  PLT 258   ------------------------------------------------------------------------------------------------------------------  Chemistries  Recent Labs  Lab 03/17/24 0237  NA 136  K 4.1  CL 101  CO2 24  GLUCOSE 125*  BUN 16  CREATININE 0.67  CALCIUM 8.7*   ------------------------------------------------------------------------------------------------------------------  Cardiac Enzymes No results for input(s): TROPONINI in the last 168 hours. ------------------------------------------------------------------------------------------------------------------  RADIOLOGY:  CT Maxillofacial W Contrast Result Date: 03/17/2024 CLINICAL DATA:  facial swelling, likely periapical abscess EXAM: CT MAXILLOFACIAL WITH CONTRAST TECHNIQUE: Multidetector CT imaging of the maxillofacial structures was performed with intravenous contrast. Multiplanar CT image reconstructions were also generated. RADIATION DOSE REDUCTION: This exam was performed according to the departmental dose-optimization program which includes automated exposure control, adjustment of the mA and/or kV according to patient size and/or use of iterative reconstruction technique. CONTRAST:   75mL OMNIPAQUE  IOHEXOL  300 MG/ML  SOLN COMPARISON:  CT of the face from May 04, 2022. FINDINGS: Approximately 1.2 x 0.3 x 1.1 cm peripherally enhancing fluid collection along the anterior right maxilla adjacent to the right lateral incisor which has a periapical lucency. Overlying soft tissue edema and thickening. Multiple additional disease teeth with periapical lucencies and dental caries. No evidence of acute fracture.  TMJs are located. Left maxillary sinus retention cyst.  Otherwise, clear sinuses. Normal appearance of the orbits. Intracranially, no obvious abnormality in the visualized brain. No mastoid effusions. IMPRESSION: Approximately 1.2 cm subperiosteal abscess adjacent to a diseased right lateral maxillary incisor with overlying cellulitis. Electronically Signed   By: Gilmore GORMAN Molt M.D.   On: 03/17/2024 03:37   DG Chest Portable 1 View Result Date: 03/17/2024 CLINICAL DATA:  Cough and wheezing EXAM: PORTABLE CHEST 1 VIEW COMPARISON:  None Available. FINDINGS: Perihilar and peribronchial thickening. No focal consolidation, pneumothorax or pleural effusion. Normal cardiomediastinal silhouette. No acute bone abnormality. IMPRESSION: Bronchitis/reactive airways. Electronically Signed   By: Norman Gatlin M.D.   On: 03/17/2024 03:20      IMPRESSION AND PLAN:  Assessment and Plan: * Facial cellulitis - This is clearly secondary to tooth abscess. - The patient has been on p.o. Augmentin  without significant improvement and therefore will be admitted for observation for further IV antibiotic therapy. - Will continue him on IV Unasyn . - Pain management will be provided. - She has a Education officer, community appointment tomorrow. - He may be interested in leaving late this afternoon  or in the evening.  Essential hypertension - Will continue antihypertensive therapy.  Type 2 diabetes mellitus without complications (HCC) - He will be placed on supplemental coverage with NovoLog . - Will hold off  metformin .   DVT prophylaxis: Lovenox .  Advanced Care Planning:  Code Status: full code.  Family Communication:  The plan of care was discussed in details with the patient (and family). I answered all questions. The patient agreed to proceed with the above mentioned plan. Further management will depend upon hospital course. Disposition Plan: Back to previous home environment Consults called: none.  All the records are reviewed and case discussed with ED provider.  Status is: Observation  I certify that at the time of admission, it is my clinical judgment that the patient will require inpatient hospital care extending more than 2 midnights.                            Dispo: The patient is from: Home              Anticipated d/c is to: Home              Patient currently is not medically stable to d/c.              Difficult to place patient: No  Madison DELENA Peaches M.D on 03/17/2024 at 6:24 AM  [facility]   From 7 PM-7 AM, contact night-coverage [email or web address]  CC: Primary care physician; Pcp, No

## 2024-03-17 NOTE — Assessment & Plan Note (Signed)
-   Will continue antihypertensive therapy.

## 2024-03-17 NOTE — ED Provider Notes (Signed)
 [facility] Provider Note    Event Date/Time   First MD Initiated Contact with Patient 03/17/24 0129     (approximate)   History   Dental Pain   HPI  Devin Barnes is a 43 y.o. male with history of hypertension, diabetes, obesity who presents to the emergency department complaints of severe right facial swelling and pain.  Recently seen by his dentist and started on Augmentin  and has an appointment on Thursday to have a tooth pulled.  He denies any fevers but states that he has taken 2 doses of Augmentin  and his face swell up significantly tonight.  No difficulty swallowing, speaking or breathing.   History provided by patient.    Past Medical History:  Diagnosis Date   Hypertension     Past Surgical History:  Procedure Laterality Date   ANAL FISSURE REPAIR      MEDICATIONS:  Prior to Admission medications   Medication Sig Start Date End Date Taking? Authorizing Provider  amoxicillin  (AMOXIL ) 875 MG tablet Take 1 tablet (875 mg total) by mouth 2 (two) times daily. 03/13/23   Cuthriell, Dorn BIRCH, PA-C  Blood Glucose Monitoring Suppl DEVI 1 each by Does not apply route in the morning, at noon, and at bedtime. May substitute to any manufacturer covered by patient's insurance. 03/13/23   Cuthriell, Dorn BIRCH, PA-C  hydrochlorothiazide  (HYDRODIURIL ) 25 MG tablet Take 1 tablet (25 mg total) by mouth daily. 07/05/19   Saunders Shona CROME, PA-C  metFORMIN  (GLUCOPHAGE ) 500 MG tablet Take 1 tablet (500 mg total) by mouth 2 (two) times daily with a meal. 03/13/23   Cuthriell, Dorn BIRCH, PA-C    Physical Exam   Triage Vital Signs: ED Triage Vitals [03/17/24 0035]  Encounter Vitals Group     BP (!) 188/111     Girls Systolic BP Percentile      Girls Diastolic BP Percentile      Boys Systolic BP Percentile      Boys Diastolic BP Percentile      Pulse Rate 86     Resp 20     Temp 98.4 F (36.9 C)     Temp Source Oral     SpO2 100 %     Weight 282 lb  (127.9 kg)     Height 5' 10 (1.778 m)     Head Circumference      Peak Flow      Pain Score 10     Pain Loc      Pain Education      Exclude from Growth Chart     Most recent vital signs: Vitals:   03/17/24 0612 03/17/24 0726  BP: (!) 142/84 (!) 142/91  Pulse: 79 66  Resp: 18 16  Temp: 98.2 F (36.8 C) 98.4 F (36.9 C)  SpO2: 96% 91%    CONSTITUTIONAL: Alert, responds appropriately to questions.  Appears uncomfortable HEAD: Normocephalic, atraumatic EYES: Conjunctivae clear, pupils appear equal, sclera nonicteric ENT: normal nose; moist mucous membranes, poor dentition, no obvious sign of drainable dental abscess, significant right-sided facial swelling without overlying redness or warmth.  No normal phonation.  No trismus, stridor or drooling.  Airway patent. NECK: Supple, normal ROM, no appreciable cervical lymphadenopathy on exam, trachea midline, no thyromegaly CARD: RRR; S1 and S2 appreciated RESP: Normal chest excursion without splinting or tachypnea; breath sounds clear and equal bilaterally; no wheezes, no rhonchi, no rales, no hypoxia or respiratory distress, speaking full sentences ABD/GI: Non-distended; soft, non-tender, no  rebound, no guarding, no peritoneal signs BACK: The back appears normal EXT: Normal ROM in all joints; no deformity noted, no edema SKIN: Normal color for age and race; warm; no rash on exposed skin NEURO: Moves all extremities equally, normal speech PSYCH: The patient's mood and manner are appropriate.   ED Results / Procedures / Treatments   LABS: (all labs ordered are listed, but only abnormal results are displayed) Labs Reviewed  CBC WITH DIFFERENTIAL/PLATELET - Abnormal; Notable for the following components:      Result Value   WBC 10.8 (*)    Monocytes Absolute 1.1 (*)    All other components within normal limits  BASIC METABOLIC PANEL WITH GFR - Abnormal; Notable for the following components:   Glucose, Bld 125 (*)    Calcium 8.7  (*)    All other components within normal limits  GLUCOSE, CAPILLARY - Abnormal; Notable for the following components:   Glucose-Capillary 195 (*)    All other components within normal limits  CULTURE, BLOOD (ROUTINE X 2)  CULTURE, BLOOD (ROUTINE X 2)  RESP PANEL BY RT-PCR (RSV, FLU A&B, COVID)  [id number]  LACTIC ACID, PLASMA  LACTIC ACID, PLASMA  HEMOGLOBIN A1C  HIV ANTIBODY (ROUTINE TESTING W REFLEX)     EKG:  RADIOLOGY: My personal review and interpretation of imaging: CT scan shows facial cellulitis, dental abscess.  I have personally reviewed all radiology reports.   CT Maxillofacial W Contrast Result Date: 03/17/2024 CLINICAL DATA:  facial swelling, likely periapical abscess EXAM: CT MAXILLOFACIAL WITH CONTRAST TECHNIQUE: Multidetector CT imaging of the maxillofacial structures was performed with intravenous contrast. Multiplanar CT image reconstructions were also generated. RADIATION DOSE REDUCTION: This exam was performed according to the departmental dose-optimization program which includes automated exposure control, adjustment of the mA and/or kV according to patient size and/or use of iterative reconstruction technique. CONTRAST:  75mL OMNIPAQUE  IOHEXOL  300 MG/ML  SOLN COMPARISON:  CT of the face from May 04, 2022. FINDINGS: Approximately 1.2 x 0.3 x 1.1 cm peripherally enhancing fluid collection along the anterior right maxilla adjacent to the right lateral incisor which has a periapical lucency. Overlying soft tissue edema and thickening. Multiple additional disease teeth with periapical lucencies and dental caries. No evidence of acute fracture.  TMJs are located. Left maxillary sinus retention cyst.  Otherwise, clear sinuses. Normal appearance of the orbits. Intracranially, no obvious abnormality in the visualized brain. No mastoid effusions. IMPRESSION: Approximately 1.2 cm subperiosteal abscess adjacent to a diseased right lateral maxillary incisor with overlying cellulitis.  Electronically Signed   By: Gilmore GORMAN Molt M.D.   On: 03/17/2024 03:37   DG Chest Portable 1 View Result Date: 03/17/2024 CLINICAL DATA:  Cough and wheezing EXAM: PORTABLE CHEST 1 VIEW COMPARISON:  None Available. FINDINGS: Perihilar and peribronchial thickening. No focal consolidation, pneumothorax or pleural effusion. Normal cardiomediastinal silhouette. No acute bone abnormality. IMPRESSION: Bronchitis/reactive airways. Electronically Signed   By: Norman Gatlin M.D.   On: 03/17/2024 03:20     PROCEDURES:  Critical Care performed: No     Procedures    IMPRESSION / MDM / ASSESSMENT AND PLAN / ED COURSE  I reviewed the triage vital signs and the nursing notes.    Patient here with right-sided facial swelling, dental pain.  The patient is on the cardiac monitor to evaluate for evidence of arrhythmia and/or significant heart rate changes.   DIFFERENTIAL DIAGNOSIS (includes but not limited to):   Dental abscess, facial cellulitis, sialoadenitis, parotitis   Patient's presentation is most  consistent with acute presentation with potential threat to life or bodily function.   PLAN: Will obtain labs, CT of the face.  Will give pain medication, Decadron  for swelling, IV Unasyn  for coverage for potential dental infection.   MEDICATIONS GIVEN IN ED: Medications  hydrochlorothiazide  (HYDRODIURIL ) tablet 25 mg (25 mg Oral Given 03/17/24 0835)  insulin  aspart (novoLOG ) injection 0-20 Units (4 Units Subcutaneous Given 03/17/24 0835)  insulin  aspart (novoLOG ) injection 0-5 Units (has no administration in time range)  enoxaparin  (LOVENOX ) injection 65 mg (65 mg Subcutaneous Given 03/17/24 0834)  acetaminophen  (TYLENOL ) tablet 650 mg (has no administration in time range)    Or  acetaminophen  (TYLENOL ) suppository 650 mg (has no administration in time range)  morphine  (PF) 2 MG/ML injection 2 mg (has no administration in time range)  traZODone  (DESYREL ) tablet 25 mg (has no  administration in time range)  magnesium  hydroxide (MILK OF MAGNESIA) suspension 30 mL (has no administration in time range)  ondansetron  (ZOFRAN ) tablet 4 mg (has no administration in time range)    Or  ondansetron  (ZOFRAN ) injection 4 mg (has no administration in time range)  Ampicillin -Sulbactam (UNASYN ) 3 g in sodium chloride  0.9 % 100 mL IVPB (3 g Intravenous New Bag/Given 03/17/24 0839)  Ampicillin -Sulbactam (UNASYN ) 3 g in sodium chloride  0.9 % 100 mL IVPB (0 g Intravenous Stopped 03/17/24 0430)  morphine  (PF) 4 MG/ML injection 4 mg (4 mg Intravenous Given 03/17/24 0311)  ondansetron  (ZOFRAN ) injection 4 mg (4 mg Intravenous Given 03/17/24 0334)  sodium chloride  0.9 % bolus 1,000 mL (0 mLs Intravenous Stopped 03/17/24 0744)  dexamethasone  (DECADRON ) injection 10 mg (10 mg Intravenous Given 03/17/24 0334)  ipratropium-albuterol  (DUONEB) 0.5-2.5 (3) MG/3ML nebulizer solution 3 mL (3 mLs Nebulization Given 03/17/24 0450)  iohexol  (OMNIPAQUE ) 300 MG/ML solution 75 mL (75 mLs Intravenous Contrast Given 03/17/24 0323)  morphine  (PF) 4 MG/ML injection 4 mg (4 mg Intravenous Given 03/17/24 0452)     ED COURSE: Patient's labs show leukocytosis of 10.8.  Normal lactic.  Glucose of 125.  CT of the face reviewed and interpreted by myself and the radiologist and show 1.2 cm subperiosteal abscess adjacent to the right lateral maxillary incisor with overlying cellulitis.  Minimal improvement with Decadron , Unasyn .  Patient reports feeling better.  Have recommended admission for at least a day of IV antibiotics to get his facial cellulitis and swelling better and then he can follow-up with his dentist tomorrow as scheduled to have the tooth pulled.  He is agreeable to this plan.  Will discuss with the hospitalist.   CONSULTS:  Consulted and discussed patient's case with hospitalist, Dr. Lawence.  I have recommended admission and consulting physician agrees and will place admission orders.  Patient (and family if  present) agree with this plan.   I reviewed all nursing notes, vitals, pertinent previous records.  All labs, EKGs, imaging ordered have been independently reviewed and interpreted by myself.    OUTSIDE RECORDS REVIEWED: No prior records for review.  Patient has been seen several times in the emergency department for pain due to dental caries and cellulitis of his extremities.       FINAL CLINICAL IMPRESSION(S) / ED DIAGNOSES   Final diagnoses:  Dental abscess  Facial cellulitis     Rx / DC Orders   ED Discharge Orders     None        Note:  This document was prepared using Dragon voice recognition software and may include unintentional dictation errors.   Lavanna Rog,  Josette SAILOR, DO 03/17/24 [phone]

## 2024-03-17 NOTE — Plan of Care (Signed)
 Patient states that oral pain and swelling are significantly improved with IV antibiotics.    He had no sepsis physiology on presentation.    Patient will receive one additional dose of IV unasyn  and will discharge with augmentin . He has an appointment with his dentist in the AM for removal of the tooth with abscess.

## 2024-03-17 NOTE — Assessment & Plan Note (Signed)
-   This is clearly secondary to tooth abscess. - The patient has been on p.o. Augmentin  without significant improvement and therefore will be admitted for observation for further IV antibiotic therapy. - Will continue him on IV Unasyn . - Pain management will be provided. - She has a Education officer, community appointment tomorrow. - He may be interested in leaving late this afternoon or in the evening.

## 2024-03-20 NOTE — Discharge Summary (Signed)
 Physician Discharge Summary   Patient: Devin Barnes MRN: [id number] DOB: 1981-03-25  Admit date:     03/17/2024  Discharge date: 03/17/2024  Discharge Physician: Alban Pepper   PCP: Pcp, No   Recommendations at discharge:    Needs outpatient blood pressure monitoring and possible resumption of oral antihypertensive.   Discharge Diagnoses: Principal Problem:   Facial cellulitis Active Problems:   Type 2 diabetes mellitus without complications (HCC)   Essential hypertension  Resolved Problems:   * No resolved hospital problems. [facility] Course: Swelling decreased, no increased warmth or erythema of overlying skin.   Assessment and Plan: * Facial cellulitis Tooth abscess Patient was seen by dentist and started on p.o. Augmentin  unfortunately he only took 1 tablet before pain and swelling prompted him to come to the ED.  CT maxillofacial showed tooth abscess and findings consistent with cellulitis.  He was started on IV Unasyn  and noted marked improvement in his symptoms.  He remained hemodynamically stable during this time.  Patient will discharge on previously prescribed Augmentin . He will see dentist in the AM for tooth extraction and abscess drainage.    Essential hypertension - Will continue antihypertensive therapy.  Type 2 diabetes mellitus without complications (HCC) Reportedly diagnosed and on metformin  in the past. A1c 6 this hospitalization. Discussed importance of following up with PCP.   Substance use disorder  On suboxone.    Class 3 obesity  BMI 40.46.  Complicates care.      Consultants: None Procedures performed: None  Disposition: Home Diet recommendation:  Carb modified diet DISCHARGE MEDICATION: Allergies as of 03/17/2024   No Known Allergies      Medication List     TAKE these medications    amoxicillin  875 MG tablet Commonly known as: AMOXIL  Take 1 tablet (875 mg total) by mouth 2 (two) times daily.   Blood Glucose Monitoring  Suppl Devi 1 each by Does not apply route in the morning, at noon, and at bedtime. May substitute to any manufacturer covered by patient's insurance.   hydrochlorothiazide  25 MG tablet Commonly known as: HYDRODIURIL  Take 1 tablet (25 mg total) by mouth daily.   metFORMIN  500 MG tablet Commonly known as: GLUCOPHAGE  Take 1 tablet (500 mg total) by mouth 2 (two) times daily with a meal.        Discharge Exam: Filed Weights   03/17/24 0035  Weight: 127.9 kg   Physical Exam  Constitutional: In no distress.  Face: R facial swelling decreased, no erythema of overlying skin, No increased warmth to touch  Cardiovascular: Normal rate, regular rhythm. No lower extremity edema  Pulmonary: Non labored breathing on room air, no wheezing or rales.   Abdominal: Soft. Non distended and non tender Musculoskeletal: Normal range of motion.     Neurological: Alert and oriented to person, place, and time. Non focal  Skin: Skin is warm and dry.    Condition at discharge: improving  The results of significant diagnostics from this hospitalization (including imaging, microbiology, ancillary and laboratory) are listed below for reference.   Imaging Studies: CT Maxillofacial W Contrast Result Date: 03/17/2024 CLINICAL DATA:  facial swelling, likely periapical abscess EXAM: CT MAXILLOFACIAL WITH CONTRAST TECHNIQUE: Multidetector CT imaging of the maxillofacial structures was performed with intravenous contrast. Multiplanar CT image reconstructions were also generated. RADIATION DOSE REDUCTION: This exam was performed according to the departmental dose-optimization program which includes automated exposure control, adjustment of the mA and/or kV according to patient size and/or use of iterative reconstruction  technique. CONTRAST:  75mL OMNIPAQUE  IOHEXOL  300 MG/ML  SOLN COMPARISON:  CT of the face from May 04, 2022. FINDINGS: Approximately 1.2 x 0.3 x 1.1 cm peripherally enhancing fluid collection along  the anterior right maxilla adjacent to the right lateral incisor which has a periapical lucency. Overlying soft tissue edema and thickening. Multiple additional disease teeth with periapical lucencies and dental caries. No evidence of acute fracture.  TMJs are located. Left maxillary sinus retention cyst.  Otherwise, clear sinuses. Normal appearance of the orbits. Intracranially, no obvious abnormality in the visualized brain. No mastoid effusions. IMPRESSION: Approximately 1.2 cm subperiosteal abscess adjacent to a diseased right lateral maxillary incisor with overlying cellulitis. Electronically Signed   By: Gilmore GORMAN Molt M.D.   On: 03/17/2024 03:37   DG Chest Portable 1 View Result Date: 03/17/2024 CLINICAL DATA:  Cough and wheezing EXAM: PORTABLE CHEST 1 VIEW COMPARISON:  None Available. FINDINGS: Perihilar and peribronchial thickening. No focal consolidation, pneumothorax or pleural effusion. Normal cardiomediastinal silhouette. No acute bone abnormality. IMPRESSION: Bronchitis/reactive airways. Electronically Signed   By: Norman Gatlin M.D.   On: 03/17/2024 03:20    Microbiology: Results for orders placed or performed during the hospital encounter of 03/17/24  Blood culture (routine x 2)     Status: None (Preliminary result)   Collection Time: 03/17/24  2:37 AM   Specimen: BLOOD  Result Value Ref Range Status   Specimen Description BLOOD RIGHT ASSIST CONTROL  Final   Special Requests   Final    BOTTLES DRAWN AEROBIC AND ANAEROBIC Blood Culture results may not be optimal due to an inadequate volume of blood received in culture bottles   Culture   Final    NO GROWTH 3 DAYS Performed at [facility], [address] [place], [place] [address]    Report Status PENDING  Incomplete  Blood culture (routine x 2)     Status: None (Preliminary result)   Collection Time: 03/17/24  3:10 AM   Specimen: BLOOD  Result Value Ref Range Status   Specimen Description BLOOD LEFT HAND  Final    Special Requests   Final    BOTTLES DRAWN AEROBIC AND ANAEROBIC Blood Culture results may not be optimal due to an inadequate volume of blood received in culture bottles   Culture   Final    NO GROWTH 3 DAYS Performed at [facility], [address], [place], [place] [address]    Report Status PENDING  Incomplete    Labs: CBC: Recent Labs  Lab 03/17/24 0237  WBC 10.8*  NEUTROABS 7.7  HGB 14.6  HCT 40.7  MCV 88.1  PLT 258   Basic Metabolic Panel: Recent Labs  Lab 03/17/24 0237  NA 136  K 4.1  CL 101  CO2 24  GLUCOSE 125*  BUN 16  CREATININE 0.67  CALCIUM 8.7*   Liver Function Tests: No results for input(s): AST, ALT, ALKPHOS, BILITOT, PROT, ALBUMIN in the last 168 hours. CBG: Recent Labs  Lab 03/17/24 0722 03/17/24 1154  GLUCAP 195* 150*    Discharge time spent: greater than 30 minutes.  Signed: Alban Pepper, MD [facility] 03/20/2024

## 2024-03-22 LAB — CULTURE, BLOOD (ROUTINE X 2)
Culture: NO GROWTH
Culture: NO GROWTH
